# Patient Record
Sex: Male | Born: 1940 | Race: Black or African American | Hispanic: No | Smoking: Former smoker
Health system: Southern US, Community
[De-identification: ages and names within clinical notes are randomized; demographics above are authoritative.]

## PROBLEM LIST (undated history)

## (undated) DIAGNOSIS — M1990 Unspecified osteoarthritis, unspecified site: Secondary | ICD-10-CM

## (undated) DIAGNOSIS — M109 Gout, unspecified: Secondary | ICD-10-CM

## (undated) DIAGNOSIS — K5790 Diverticulosis of intestine, part unspecified, without perforation or abscess without bleeding: Secondary | ICD-10-CM

## (undated) DIAGNOSIS — R519 Headache, unspecified: Secondary | ICD-10-CM

## (undated) DIAGNOSIS — R51 Headache: Secondary | ICD-10-CM

## (undated) DIAGNOSIS — N189 Chronic kidney disease, unspecified: Secondary | ICD-10-CM

## (undated) DIAGNOSIS — E785 Hyperlipidemia, unspecified: Secondary | ICD-10-CM

## (undated) DIAGNOSIS — I10 Essential (primary) hypertension: Secondary | ICD-10-CM

## (undated) HISTORY — DX: Chronic kidney disease, unspecified: N18.9

## (undated) HISTORY — DX: Hyperlipidemia, unspecified: E78.5

## (undated) HISTORY — DX: Diverticulosis of intestine, part unspecified, without perforation or abscess without bleeding: K57.90

## (undated) HISTORY — DX: Essential (primary) hypertension: I10

## (undated) HISTORY — PX: KNEE SURGERY: SHX244

---

## 2004-10-15 ENCOUNTER — Ambulatory Visit: Payer: Self-pay | Admitting: Family Medicine

## 2004-11-04 ENCOUNTER — Ambulatory Visit: Payer: Self-pay | Admitting: Family Medicine

## 2004-11-04 ENCOUNTER — Ambulatory Visit (HOSPITAL_COMMUNITY): Admission: RE | Admit: 2004-11-04 | Discharge: 2004-11-04 | Payer: Self-pay | Admitting: Family Medicine

## 2005-01-16 ENCOUNTER — Ambulatory Visit: Payer: Self-pay | Admitting: Family Medicine

## 2005-02-11 ENCOUNTER — Ambulatory Visit: Payer: Self-pay | Admitting: Family Medicine

## 2005-05-13 ENCOUNTER — Ambulatory Visit: Payer: Self-pay | Admitting: Family Medicine

## 2005-10-29 ENCOUNTER — Ambulatory Visit: Payer: Self-pay | Admitting: Family Medicine

## 2005-12-15 ENCOUNTER — Ambulatory Visit: Payer: Self-pay | Admitting: Family Medicine

## 2006-01-28 ENCOUNTER — Ambulatory Visit: Payer: Self-pay | Admitting: Family Medicine

## 2006-06-17 ENCOUNTER — Encounter (INDEPENDENT_AMBULATORY_CARE_PROVIDER_SITE_OTHER): Payer: Self-pay | Admitting: *Deleted

## 2006-06-17 ENCOUNTER — Ambulatory Visit: Payer: Self-pay | Admitting: Family Medicine

## 2006-06-17 LAB — CONVERTED CEMR LAB
ALT: <8 units/L (ref 0–53)
Albumin: 4.2 g/dL (ref 3.5–5.2)
Basophils Absolute: 0 10*3/uL (ref 0.0–0.1)
Bilirubin, Direct: 0.1 mg/dL (ref 0.0–0.3)
Calcium: 9.3 mg/dL (ref 8.4–10.5)
Creatinine, Ser: 1.3 mg/dL (ref 0.40–1.50)
Lymphocytes Relative: 46 % (ref 12–46)
Lymphs Abs: 2.6 10*3/uL (ref 0.7–3.3)
MCHC: 32.2 g/dL (ref 30.0–36.0)
PSA: 1.25 ng/mL
Platelets: 247 10*3/uL (ref 150–400)
Potassium: 4.6 meq/L (ref 3.5–5.3)
RDW: 13.8 % (ref 11.5–14.0)
Total Bilirubin: 0.5 mg/dL (ref 0.3–1.2)
Total CHOL/HDL Ratio: 6.6
Total Protein: 7.7 g/dL (ref 6.0–8.3)
Triglycerides: 140 mg/dL (ref <150)

## 2006-10-26 ENCOUNTER — Ambulatory Visit: Payer: Self-pay | Admitting: Family Medicine

## 2006-10-26 LAB — CONVERTED CEMR LAB
Albumin: 4.1 g/dL (ref 3.5–5.2)
Alkaline Phosphatase: 63 units/L (ref 39–117)
CO2: 25 meq/L (ref 19–32)
Chloride: 104 meq/L (ref 96–112)
Cholesterol: 154 mg/dL (ref 0–200)
Creatinine, Ser: 1.25 mg/dL (ref 0.40–1.50)
Indirect Bilirubin: 0.4 mg/dL (ref 0.0–0.9)
Potassium: 4.7 meq/L (ref 3.5–5.3)
Total CHOL/HDL Ratio: 5.1
VLDL: 40 mg/dL (ref 0–40)

## 2006-11-03 ENCOUNTER — Encounter: Payer: Self-pay | Admitting: Family Medicine

## 2006-11-03 LAB — CONVERTED CEMR LAB: Glucose, 2 hour: 344 mg/dL — ABNORMAL HIGH (ref 70–139)

## 2006-11-10 ENCOUNTER — Ambulatory Visit: Payer: Self-pay | Admitting: Family Medicine

## 2006-12-17 ENCOUNTER — Ambulatory Visit: Payer: Self-pay | Admitting: Family Medicine

## 2007-02-18 ENCOUNTER — Ambulatory Visit: Payer: Self-pay | Admitting: Family Medicine

## 2007-02-18 LAB — CONVERTED CEMR LAB
ALT: 10 units/L (ref 0–53)
Albumin: 4.3 g/dL (ref 3.5–5.2)
CO2: 24 meq/L (ref 19–32)
Chloride: 104 meq/L (ref 96–112)
Cholesterol: 191 mg/dL (ref 0–200)
Creatinine, Ser: 1.65 mg/dL — ABNORMAL HIGH (ref 0.40–1.50)
HDL: 38 mg/dL — ABNORMAL LOW (ref >39)
Sodium: 141 meq/L (ref 135–145)
Total Bilirubin: 0.4 mg/dL (ref 0.3–1.2)
Total CHOL/HDL Ratio: 5
Triglycerides: 202 mg/dL — ABNORMAL HIGH (ref <150)

## 2007-02-19 ENCOUNTER — Encounter: Payer: Self-pay | Admitting: Family Medicine

## 2007-03-29 ENCOUNTER — Ambulatory Visit: Payer: Self-pay | Admitting: Family Medicine

## 2007-05-24 ENCOUNTER — Encounter (INDEPENDENT_AMBULATORY_CARE_PROVIDER_SITE_OTHER): Payer: Self-pay | Admitting: *Deleted

## 2007-05-24 DIAGNOSIS — E785 Hyperlipidemia, unspecified: Secondary | ICD-10-CM | POA: Insufficient documentation

## 2007-05-24 DIAGNOSIS — E1122 Type 2 diabetes mellitus with diabetic chronic kidney disease: Secondary | ICD-10-CM

## 2007-05-24 DIAGNOSIS — N183 Chronic kidney disease, stage 3 (moderate): Secondary | ICD-10-CM

## 2007-05-27 ENCOUNTER — Ambulatory Visit: Payer: Self-pay | Admitting: Family Medicine

## 2007-05-27 LAB — CONVERTED CEMR LAB
CO2: 23 meq/L (ref 19–32)
Calcium: 9.6 mg/dL (ref 8.4–10.5)
Cholesterol: 179 mg/dL (ref 0–200)
Glucose, Bld: 128 mg/dL — ABNORMAL HIGH (ref 70–99)
HDL: 34 mg/dL — ABNORMAL LOW (ref >39)
Indirect Bilirubin: 0.2 mg/dL (ref 0.0–0.9)
LDL Cholesterol: 115 mg/dL — ABNORMAL HIGH (ref 0–99)
Potassium: 4.6 meq/L (ref 3.5–5.3)
Sodium: 142 meq/L (ref 135–145)
Total CHOL/HDL Ratio: 5.3
Total Protein: 8 g/dL (ref 6.0–8.3)
Triglycerides: 151 mg/dL — ABNORMAL HIGH (ref <150)
VLDL: 30 mg/dL (ref 0–40)

## 2007-09-27 ENCOUNTER — Encounter: Payer: Self-pay | Admitting: Family Medicine

## 2007-09-27 ENCOUNTER — Ambulatory Visit: Payer: Self-pay | Admitting: Family Medicine

## 2007-09-27 DIAGNOSIS — I10 Essential (primary) hypertension: Secondary | ICD-10-CM

## 2007-09-27 LAB — CONVERTED CEMR LAB
ALT: 10 units/L (ref 0–53)
Alkaline Phosphatase: 55 units/L (ref 39–117)
BUN: 19 mg/dL (ref 6–23)
Calcium: 9.3 mg/dL (ref 8.4–10.5)
HDL: 33 mg/dL — ABNORMAL LOW (ref >39)
Potassium: 4.8 meq/L (ref 3.5–5.3)
Total Bilirubin: 0.3 mg/dL (ref 0.3–1.2)
Total Protein: 7.9 g/dL (ref 6.0–8.3)
Triglycerides: 118 mg/dL (ref <150)

## 2007-10-13 ENCOUNTER — Encounter: Payer: Self-pay | Admitting: Family Medicine

## 2007-11-01 ENCOUNTER — Encounter: Payer: Self-pay | Admitting: Family Medicine

## 2007-12-21 ENCOUNTER — Encounter: Payer: Self-pay | Admitting: Family Medicine

## 2008-01-26 ENCOUNTER — Encounter: Payer: Self-pay | Admitting: Family Medicine

## 2008-02-17 ENCOUNTER — Encounter: Payer: Self-pay | Admitting: Family Medicine

## 2008-03-16 ENCOUNTER — Ambulatory Visit (HOSPITAL_COMMUNITY): Admission: RE | Admit: 2008-03-16 | Discharge: 2008-03-16 | Payer: Self-pay | Admitting: Ophthalmology

## 2008-03-21 ENCOUNTER — Ambulatory Visit: Payer: Self-pay | Admitting: Family Medicine

## 2008-03-21 LAB — CONVERTED CEMR LAB: Hgb A1c MFr Bld: 6.3 %

## 2008-03-22 ENCOUNTER — Encounter: Payer: Self-pay | Admitting: Family Medicine

## 2008-03-22 LAB — CONVERTED CEMR LAB
AST: 12 units/L (ref 0–37)
Alkaline Phosphatase: 59 units/L (ref 39–117)
BUN: 21 mg/dL (ref 6–23)
Bilirubin, Direct: <0.1 mg/dL (ref 0.0–0.3)
CO2: 19 meq/L (ref 19–32)
Calcium: 9.3 mg/dL (ref 8.4–10.5)
Chloride: 108 meq/L (ref 96–112)
Cholesterol: 151 mg/dL (ref 0–200)
Creatinine, Ser: 1.67 mg/dL — ABNORMAL HIGH (ref 0.40–1.50)
Creatinine, Urine: 141.2 mg/dL
Glucose, Bld: 95 mg/dL (ref 70–99)
LDL Cholesterol: 96 mg/dL (ref 0–99)
Microalb Creat Ratio: 2.6 mg/g (ref 0.0–30.0)
Potassium: 4.5 meq/L (ref 3.5–5.3)
Sodium: 141 meq/L (ref 135–145)
Total Bilirubin: 0.2 mg/dL — ABNORMAL LOW (ref 0.3–1.2)
Total CHOL/HDL Ratio: 4.6
Triglycerides: 111 mg/dL (ref <150)
VLDL: 22 mg/dL (ref 0–40)

## 2008-03-30 ENCOUNTER — Ambulatory Visit (HOSPITAL_COMMUNITY): Admission: RE | Admit: 2008-03-30 | Discharge: 2008-03-30 | Payer: Self-pay | Admitting: Ophthalmology

## 2008-06-29 ENCOUNTER — Ambulatory Visit: Payer: Self-pay | Admitting: Family Medicine

## 2008-06-29 LAB — CONVERTED CEMR LAB: Hgb A1c MFr Bld: 6.4 %

## 2008-06-30 LAB — CONVERTED CEMR LAB
BUN: 20 mg/dL (ref 6–23)
Basophils Absolute: 0 10*3/uL (ref 0.0–0.1)
CO2: 21 meq/L (ref 19–32)
Chloride: 107 meq/L (ref 96–112)
Creatinine, Ser: 1.65 mg/dL — ABNORMAL HIGH (ref 0.40–1.50)
Hemoglobin: 12.7 g/dL — ABNORMAL LOW (ref 13.0–17.0)
MCHC: 32.1 g/dL (ref 30.0–36.0)
RBC: 4 M/uL — ABNORMAL LOW (ref 4.22–5.81)
TSH: 2.956 microintl units/mL (ref 0.350–4.500)

## 2008-09-28 ENCOUNTER — Ambulatory Visit: Payer: Self-pay | Admitting: Family Medicine

## 2008-09-28 LAB — CONVERTED CEMR LAB
Blood Glucose, Fasting: 112 mg/dL
Hgb A1c MFr Bld: 5.9 %

## 2008-10-31 ENCOUNTER — Encounter: Payer: Self-pay | Admitting: Family Medicine

## 2008-11-09 LAB — CONVERTED CEMR LAB
AST: 14 units/L (ref 0–37)
Albumin: 4.1 g/dL (ref 3.5–5.2)
Alkaline Phosphatase: 52 units/L (ref 39–117)
HDL: 33 mg/dL — ABNORMAL LOW (ref >39)
Indirect Bilirubin: 0.3 mg/dL (ref 0.0–0.9)
PSA: 0.96 ng/mL (ref 0.10–4.00)
Total CHOL/HDL Ratio: 4.4
Total Protein: 7.2 g/dL (ref 6.0–8.3)

## 2008-12-14 ENCOUNTER — Ambulatory Visit: Payer: Self-pay | Admitting: Family Medicine

## 2008-12-14 LAB — CONVERTED CEMR LAB: Glucose, Bld: 176 mg/dL

## 2009-04-19 ENCOUNTER — Ambulatory Visit: Payer: Self-pay | Admitting: Family Medicine

## 2009-04-19 LAB — CONVERTED CEMR LAB
ALT: 15 units/L (ref 0–53)
AST: 16 units/L (ref 0–37)
Albumin: 4.1 g/dL (ref 3.5–5.2)
Alkaline Phosphatase: 67 units/L (ref 39–117)
BUN: 15 mg/dL (ref 6–23)
Bilirubin, Direct: 0.1 mg/dL (ref 0.0–0.3)
CO2: 26 meq/L (ref 19–32)
Calcium: 9.1 mg/dL (ref 8.4–10.5)
Chloride: 103 meq/L (ref 96–112)
Cholesterol: 152 mg/dL (ref 0–200)
Creatinine, Ser: 1.69 mg/dL — ABNORMAL HIGH (ref 0.40–1.50)
Creatinine, Urine: 247.2 mg/dL
Glucose, Bld: 250 mg/dL — ABNORMAL HIGH (ref 70–99)
HDL: 34 mg/dL — ABNORMAL LOW (ref >39)
Indirect Bilirubin: 0.4 mg/dL (ref 0.0–0.9)
LDL Cholesterol: 91 mg/dL (ref 0–99)
Microalb Creat Ratio: 13.6 mg/g (ref 0.0–30.0)
Microalb, Ur: 3.35 mg/dL — ABNORMAL HIGH (ref 0.00–1.89)
Potassium: 4.4 meq/L (ref 3.5–5.3)
Sodium: 139 meq/L (ref 135–145)
Total Bilirubin: 0.5 mg/dL (ref 0.3–1.2)
Total CHOL/HDL Ratio: 4.5
Total Protein: 7.6 g/dL (ref 6.0–8.3)
Triglycerides: 137 mg/dL (ref <150)
VLDL: 27 mg/dL (ref 0–40)

## 2009-09-14 ENCOUNTER — Ambulatory Visit: Payer: Self-pay | Admitting: Family Medicine

## 2009-09-17 LAB — CONVERTED CEMR LAB
BUN: 18 mg/dL (ref 6–23)
Chloride: 104 meq/L (ref 96–112)
Creatinine, Ser: 1.38 mg/dL (ref 0.40–1.50)
Hgb A1c MFr Bld: 13.7 % — ABNORMAL HIGH (ref <5.7)
Sodium: 136 meq/L (ref 135–145)

## 2009-09-24 ENCOUNTER — Encounter: Payer: Self-pay | Admitting: Family Medicine

## 2009-10-30 ENCOUNTER — Telehealth: Payer: Self-pay | Admitting: Family Medicine

## 2009-12-14 ENCOUNTER — Telehealth: Payer: Self-pay | Admitting: Family Medicine

## 2009-12-18 ENCOUNTER — Ambulatory Visit: Payer: Self-pay | Admitting: Family Medicine

## 2009-12-19 LAB — CONVERTED CEMR LAB
Albumin: 4.5 g/dL (ref 3.5–5.2)
BUN: 17 mg/dL (ref 6–23)
Basophils Absolute: 0 10*3/uL (ref 0.0–0.1)
Creatinine, Ser: 1.76 mg/dL — ABNORMAL HIGH (ref 0.40–1.50)
Eosinophils Absolute: 0.1 10*3/uL (ref 0.0–0.7)
Glucose, Bld: 106 mg/dL — ABNORMAL HIGH (ref 70–99)
HCT: 40.3 % (ref 39.0–52.0)
HDL: 33 mg/dL — ABNORMAL LOW (ref >39)
Indirect Bilirubin: 0.3 mg/dL (ref 0.0–0.9)
LDL Cholesterol: 82 mg/dL (ref 0–99)
Lymphocytes Relative: 45 % (ref 12–46)
MCV: 96 fL (ref 78.0–100.0)
Monocytes Absolute: 0.5 10*3/uL (ref 0.1–1.0)
Neutro Abs: 1.8 10*3/uL (ref 1.7–7.7)
PSA: 1.3 ng/mL (ref 0.10–4.00)
Platelets: 219 10*3/uL (ref 150–400)
Potassium: 5 meq/L (ref 3.5–5.3)
Sodium: 144 meq/L (ref 135–145)
TSH: 1.485 microintl units/mL (ref 0.350–4.500)
Total Bilirubin: 0.4 mg/dL (ref 0.3–1.2)
VLDL: 20 mg/dL (ref 0–40)

## 2010-01-21 ENCOUNTER — Encounter: Payer: Self-pay | Admitting: Family Medicine

## 2010-03-10 HISTORY — PX: CATARACT EXTRACTION W/ INTRAOCULAR LENS  IMPLANT, BILATERAL: SHX1307

## 2010-03-22 ENCOUNTER — Ambulatory Visit
Admission: RE | Admit: 2010-03-22 | Discharge: 2010-03-22 | Payer: Self-pay | Source: Home / Self Care | Attending: Family Medicine | Admitting: Family Medicine

## 2010-03-25 LAB — CONVERTED CEMR LAB
AST: 19 units/L (ref 0–37)
BUN: 22 mg/dL (ref 6–23)
CO2: 23 meq/L (ref 19–32)
Chloride: 106 meq/L (ref 96–112)
Potassium: 5 meq/L (ref 3.5–5.3)
Sodium: 140 meq/L (ref 135–145)
Total Protein: 8.2 g/dL (ref 6.0–8.3)

## 2010-03-27 LAB — CONVERTED CEMR LAB: Hgb A1c MFr Bld: 7.3 % — ABNORMAL HIGH (ref <5.7)

## 2010-03-29 ENCOUNTER — Encounter: Payer: Self-pay | Admitting: Family Medicine

## 2010-04-09 NOTE — Progress Notes (Signed)
Summary: NOT CHECKING HIS SUGARS  Phone Note Call from Patient   Summary of Call: PAUL SAID THAT Zyier IS NOT CHECKING HIS SUGARS AND HE STOPPED THE LADY THAT WAS DOING IT 2 X A DAY  AND HE WANTS YOU TO GET ON [place] AND NOT LET HIM KNOW THAT HE SAID ANYTHING ABOUT THIS THEIR APPTOINMENTS ARE TUESDAY 10.11.11 Initial call taken by: Lind Guest,  December 14, 2009 11:03 AM  Follow-up for Phone Call        noted Follow-up by: Syliva Overman MD,  December 14, 2009 12:19 PM

## 2010-04-09 NOTE — Letter (Signed)
Summary: med list review sheet  med list review sheet   Imported By: Lind Guest 12/18/2009 09:03:06  _____________________________________________________________________  External Attachment:    Type:   Image     Comment:   External Document

## 2010-04-09 NOTE — Letter (Signed)
Summary: rx assistance  rx assistance   Imported By: Lind Guest 01/21/2010 10:49:01  _____________________________________________________________________  External Attachment:    Type:   Image     Comment:   External Document

## 2010-04-09 NOTE — Assessment & Plan Note (Signed)
Summary: office visit   Vital Signs:  Patient profile:   70 year old male Height:      73 inches Weight:      196 pounds BMI:     25.95 O2 Sat:      97 % Pulse rate:   83 / minute Pulse rhythm:   regular Resp:     16 per minute BP sitting:   118 / 80 Cuff size:   regular  Vitals Entered By: Everitt Amber (April 19, 2009 8:12 AM)  Nutrition Counseling: Patient's BMI is greater than 25 and therefore counseled on weight management options. CC: follow-up visit   Primary Care Provider:  Syliva Overman MD  CC:  follow-up visit.  History of Present Illness: Reports  that he has been  doing well. Denies recent fever or chills. Denies sinus pressure, nasal congestion , ear pain or sore throat. Denies chest congestion, or cough productive of sputum. Denies chest pain, palpitations, PND, orthopnea or leg swelling. Denies abdominal pain, nausea, vomitting, diarrhea or constipation. Denies change in bowel movements or bloody stool. Denies dysuria , frequency, incontinence or hesitancy. Denies  joint pain, swelling, or reduced mobility. Denies headaches, vertigo, seizures. Denies depression, anxiety or insomnia. Denies  rash, lesions, or itch.     Current Medications (verified): 1)  Pravachol 40 Mg  Tabs (Pravastatin Sodium) .... Take 1 Tablet By Mouth Once A Day 2)  Aspirin Adult Low Strength 81 Mg Tbec (Aspirin) .... Take 1 Tablet By Mouth Once A Day 3)  Lotensin 10 Mg Tabs (Benazepril Hcl) .... Take 1 Tablet By Mouth Once A Day 4)  Glucotrol Xl 5 Mg Xr24h-Tab (Glipizide) .... 2 Tablets in The Morning At Breakfast and 1 Tablet Iin The Evening At Supper Time 5)  Vitamin D3 2000 Unit Caps (Cholecalciferol) .... Take 1 Tablet By Mouth Once A Day  Allergies (verified): No Known Drug Allergies  Review of Systems      See HPI Eyes:  Denies blurring and discharge. Endo:  Denies cold intolerance, excessive hunger, excessive thirst, excessive urination, heat intolerance,  polyuria, and weight change. Heme:  Denies abnormal bruising and bleeding. Allergy:  Denies hives or rash, seasonal allergies, and sneezing.  Physical Exam  General:  alert, well-nourished, and well-hydrated.  HEENT: No facial asymmetry,  EOMI, No sinus tenderness, TM's Clear, oropharynx  pink and moist.   Chest: Clear to auscultation bilaterally.  CVS: S1, S2, No murmurs, No S3.   Abd: Soft, Nontender.  MS: Adequate ROM spine, hips, shoulders and knees.  Ext: No edema.   CNS: CN 2-12 intact, power tone and sensation normal throughout.   Skin: Intact, no visible lesions or rashes.  Psych: Good eye contact, normal affect.  Memory intact, not anxious or depressed appearing.     Impression & Recommendations:  Problem # 1:  UNSPECIFIED ESSENTIAL HYPERTENSION (ICD-401.9) Assessment Improved  The following medications were removed from the medication list:    Lotensin 10 Mg Tabs (Benazepril hcl) .Marland Kitchen... Take 1 tablet by mouth once a day His updated medication list for this problem includes:    Benazepril Hcl 10 Mg Tabs (Benazepril hcl) .Marland Kitchen... Take 1 tablet by mouth once a day  Orders: T-Basic Metabolic Panel [id number])  BP today: 118/80 Prior BP: 140/80 (12/14/2008)  Labs Reviewed: K+: 5.1 (06/29/2008) Creat: : 1.65 (06/29/2008)   Chol: 145 (11/09/2008)   HDL: 33 (11/09/2008)   LDL: 85 (11/09/2008)   TG: 136 (11/09/2008)  Problem # 2:  DIABETES MELLITUS, TYPE II (  ICD-250.00) Assessment: Deteriorated  The following medications were removed from the medication list:    Lotensin 10 Mg Tabs (Benazepril hcl) .Marland Kitchen... Take 1 tablet by mouth once a day    Glucotrol Xl 5 Mg Xr24h-tab (Glipizide) .Marland Kitchen... 2 tablets in the morning at breakfast and 1 tablet iin the evening at supper time    Glipizide 5 Mg Xr24h-tab (Glipizide) .Marland Kitchen... Take 2 tablets in the morning and 1 in the evening His updated medication list for this problem includes:    Aspirin Adult Low Strength 81 Mg Tbec (Aspirin)  .Marland Kitchen... Take 1 tablet by mouth once a day    Benazepril Hcl 10 Mg Tabs (Benazepril hcl) .Marland Kitchen... Take 1 tablet by mouth once a day    Aspirin 81 Mg Tbec (Aspirin) .Marland Kitchen... Take 1 tablet by mouth once a day    Glipizide 10 Mg Xr24h-tab (Glipizide) .Marland Kitchen... Take 1 tablet by mouth two times a day  Orders: Glucose, (CBG) (82962) Hemoglobin A1C (83036) T-Urine Microalbumin w/creat. ratio [phone])  Labs Reviewed: Creat: 1.65 (06/29/2008)    Reviewed HgBA1c results: 10.6 (04/19/2009)  8.3 (12/14/2008)  Problem # 3:  HYPERLIPIDEMIA (ICD-272.4) Assessment: Comment Only  The following medications were removed from the medication list:    Pravachol 40 Mg Tabs (Pravastatin sodium) .Marland Kitchen... Take 1 tablet by mouth once a day His updated medication list for this problem includes:    Pravastatin Sodium 40 Mg Tabs (Pravastatin sodium) .Marland Kitchen... Take 1 tab by mouth at bedtime  Orders: T-Lipid Profile [id number]) T-Hepatic Function [phone])  Labs Reviewed: SGOT: 14 (11/09/2008)   SGPT: 10 (11/09/2008)   HDL:33 (11/09/2008), 33 (03/22/2008)  LDL:85 (11/09/2008), 96 (03/22/2008)  Chol:145 (11/09/2008), 151 (03/22/2008)  Trig:136 (11/09/2008), 111 (03/22/2008)  Complete Medication List: 1)  Aspirin Adult Low Strength 81 Mg Tbec (Aspirin) .... Take 1 tablet by mouth once a day 2)  Vitamin D3 2000 Unit Caps (Cholecalciferol) .... Take 1 tablet by mouth once a day 3)  Pravastatin Sodium 40 Mg Tabs (Pravastatin sodium) .... Take 1 tab by mouth at bedtime 4)  Benazepril Hcl 10 Mg Tabs (Benazepril hcl) .... Take 1 tablet by mouth once a day 5)  Aspirin 81 Mg Tbec (Aspirin) .... Take 1 tablet by mouth once a day 6)  Glipizide 10 Mg Xr24h-tab (Glipizide) .... Take 1 tablet by mouth two times a day  Patient Instructions: 1)  Please schedule a follow-up appointment in 4 months. 2)  It is important that you exercise regularly at least 30 minutes 5 times a week. If you develop chest pain, have severe  difficulty breathing, or feel very tired , stop exercising immediately and seek medical attention. 3)  BMP prior to visit, ICD-9: 4)  Hepatic Panel prior to visit, ICD-9: 5)  Lipid Panel prior to visit, ICD-9: 6)  Urine Microalbumin prior to visit, ICD-9: 7)  yOUR blood sugar is way  too high, your med is iinc to 10mg  twice daily Prescriptions: GLIPIZIDE 10 MG XR24H-TAB (GLIPIZIDE) Take 1 tablet by mouth two times a day  #180 x 3   Entered and Authorized by:   Syliva Overman MD   Signed by:   Syliva Overman MD on 04/19/2009   Method used:   Printed then faxed to ...       [facility]       [address]       [place], [place]  [address]  Ph: [phone] or [phone]       Fax: [phone]   RxID:   [id number] ASPIRIN 81 MG TBEC (ASPIRIN) Take 1 tablet by mouth once a day  #90 x 3   Entered and Authorized by:   Syliva Overman MD   Signed by:   Syliva Overman MD on 04/19/2009   Method used:   Printed then faxed to ...       [facility] (retail)       [address]       [place], [place]  [address]       Ph: [phone] or [phone]       Fax: [phone]   RxID:   [id number] GLIPIZIDE 5 MG XR24H-TAB (GLIPIZIDE) take 2 tablets in the morning and 1 in the evening  #270 x 3   Entered and Authorized by:   Syliva Overman MD   Signed by:   Syliva Overman MD on 04/19/2009   Method used:   Printed then faxed to ...       [facility] (retail)       [address]       [place], [place]  [address]       Ph: [phone] or [phone]       Fax: [phone]   RxID:   [id number] BENAZEPRIL HCL 10 MG TABS (BENAZEPRIL HCL) Take 1 tablet by mouth once a day  #90 x 3   Entered and Authorized by:   Syliva Overman MD   Signed by:   Syliva Overman MD on 04/19/2009   Method used:   Printed then faxed to ...       [facility]       [address]       [place], [place]  [address]       Ph: [phone] or [phone]       Fax: [phone]   RxID:   [id number] PRAVASTATIN SODIUM 40 MG TABS (PRAVASTATIN SODIUM) Take 1 tab by mouth at bedtime  #90 x 3   Entered and Authorized by:   Syliva Overman MD   Signed by:   Syliva Overman MD on 04/19/2009   Method used:   Printed then faxed to ...       [facility] (retail)       [address]       [place], [place]  [address]       Ph: [phone] or [phone]       Fax: [phone]   RxID:   [id number]   Laboratory Results   Blood Tests   Date/Time Received: April 19, 2009  Date/Time Reported: April 19, 2009   Glucose (fasting): 276 mg/dL   (Normal Range: 74-944) HGBA1C: 10.6%   (Normal Range: Non-Diabetic - 3-6%   Control Diabetic - 6-8%)

## 2010-04-09 NOTE — Assessment & Plan Note (Signed)
Summary: f up   Vital Signs:  Patient profile:   70 year old male Height:      73 inches Weight:      184.50 pounds BMI:     24.43 O2 Sat:      97 % on Room air Pulse rate:   82 / minute Pulse rhythm:   regular Resp:     16 per minute BP sitting:   118 / 72  (left arm)  Vitals Entered By: Mauricia Area, CMA  O2 Flow:  Room air CC: Follow up. Both eyes bothering him.    Primary Care Provider:  Syliva Overman MD  CC:  Follow up. Both eyes bothering him. Barry Welch  History of Present Illness: Reports  thathe has been doing fairly well. He reports improving his diet , so that his blood sugars will be better controlled.He is still not checking his sugars. He c/o bilateral eye swelling with excessive tearing , rigth grtr than left, he was recently seen by the opthalmologist Denies recent fever or chills. Denies sinus pressure, nasal congestion , ear pain or sore throat. Denies chest congestion, or cough productive of sputum. Denies chest pain, palpitations, PND, orthopnea or leg swelling. Denies abdominal pain, nausea, vomitting, diarrhea or constipation. Denies change in bowel movements or bloody stool. Denies dysuria , frequency, incontinence or hesitancy. Denies  joint pain, swelling, or reduced mobility. Denies headaches, vertigo, seizures. Denies depression, anxiety or insomnia. Denies  rash, lesions, or itch.     Current Medications (verified): 1)  Aspirin Adult Low Strength 81 Mg Tbec (Aspirin) .... Take 1 Tablet By Mouth Once A Day 2)  Vitamin D3 2000 Unit Caps (Cholecalciferol) .... Take 1 Tablet By Mouth Once A Day 3)  Pravastatin Sodium 40 Mg Tabs (Pravastatin Sodium) .... Take 1 Tab By Mouth At Bedtime 4)  Benazepril Hcl 10 Mg Tabs (Benazepril Hcl) .... Take 1 Tablet By Mouth Once A Day 5)  Aspirin 81 Mg Tbec (Aspirin) .... Take 1 Tablet By Mouth Once A Day 6)  Glipizide 10 Mg Xr24h-Tab (Glipizide) .... Take 1 Tablet By Mouth Two Times A Day  Allergies  (verified): No Known Drug Allergies  Past History:  Past Medical History: Diabetes mellitus, type II Hyperlipidemia Nicotine  quit in 2009 Alcohol denies current use  Past Surgical History: ORIF left knee (1986) R cataract extraction 03/2008  left cataract extraction 2010   Social History: Disabled x12 yrs Separated x25 5 Children 30s and 40s Current Smoker 65yrs  quit since March 2009  Alcohol use-yes 50yrs  Drug use-no  Review of Systems      See HPI General:  Complains of fatigue. Eyes:  Complains of discharge and eye irritation. Neuro:  Complains of headaches; frontal headache and right eye painx 1 week. Endo:  Denies excessive hunger, excessive thirst, excessive urination, and heat intolerance. Heme:  Denies abnormal bruising and bleeding. Allergy:  Complains of seasonal allergies.  Physical Exam  General:  alert, well-nourished, and well-hydrated.  HEENT: No facial asymmetry,  EOMI, No sinus tenderness, TM's Clear, oropharynx  pink and moist.   Chest: Clear to auscultation bilaterally.  CVS: S1, S2, No murmurs, No S3.   Abd: Soft, Nontender.  MS: Adequate ROM spine, hips, shoulders and knees.  Ext: No edema.   CNS: CN 2-12 intact, power tone and sensation normal throughout.   Skin: Intact, no visible lesions or rashes.  Psych: Good eye contact, normal affect.  Memory intact, not anxious or depressed appearing.  Impression & Recommendations:  Problem # 1:  UNSPECIFIED ESSENTIAL HYPERTENSION (ICD-401.9) Assessment Unchanged  His updated medication list for this problem includes:    Benazepril Hcl 10 Mg Tabs (Benazepril hcl) .Barry Welch... Take 1 tablet by mouth once a day  Orders: T-Basic Metabolic Panel [id number])  BP today: 118/72 Prior BP: 120/70 (09/14/2009)  Labs Reviewed: K+: 4.3 (09/14/2009) Creat: : 1.38 (09/14/2009)   Chol: 152 (04/19/2009)   HDL: 34 (04/19/2009)   LDL: 91 (04/19/2009)   TG: 137 (04/19/2009)  Problem # 2:  HYPERLIPIDEMIA  (ICD-272.4) Assessment: Comment Only  His updated medication list for this problem includes:    Pravastatin Sodium 40 Mg Tabs (Pravastatin sodium) .Barry Welch... Take 1 tab by mouth at bedtime Low fat dietdiscussed and encouraged  Orders: T-Lipid Profile [id number])  Labs Reviewed: SGOT: 16 (04/19/2009)   SGPT: 15 (04/19/2009)   HDL:34 (04/19/2009), 33 (11/09/2008)  LDL:91 (04/19/2009), 85 (11/09/2008)  Chol:152 (04/19/2009), 145 (11/09/2008)  Trig:137 (04/19/2009), 136 (11/09/2008)  Problem # 3:  DIABETES MELLITUS, TYPE II (ICD-250.00)  The following medications were removed from the medication list:    Onglyza 5 Mg Tabs (Saxagliptin hcl) ..... One tab by mouth once daily His updated medication list for this problem includes:    Aspirin Adult Low Strength 81 Mg Tbec (Aspirin) .Barry Welch... Take 1 tablet by mouth once a day    Benazepril Hcl 10 Mg Tabs (Benazepril hcl) .Barry Welch... Take 1 tablet by mouth once a day    Aspirin 81 Mg Tbec (Aspirin) .Barry Welch... Take 1 tablet by mouth once a day    Glipizide 10 Mg Xr24h-tab (Glipizide) .Barry Welch... Take 1 tablet by mouth two times a day    Onglyza 5 Mg Tabs (Saxagliptin hcl) .Barry Welch... Take 1 tablet by mouth once a day Patient advised to reduce carbs and sweets, commit to regular physical activity, take meds as prescribed, test blood sugars as directed, and attempt to lose weight , to improve blood sugar control. Pt will have h/h referralif today's hBA1C is still extremely high, he understands and agrees. Orders: T- Hemoglobin A1C ([id number])  Labs Reviewed: Creat: 1.38 (09/14/2009)    Reviewed HgBA1c results: 13.7 (09/14/2009)  10.6 (04/19/2009)  Complete Medication List: 1)  Aspirin Adult Low Strength 81 Mg Tbec (Aspirin) .... Take 1 tablet by mouth once a day 2)  Vitamin D3 2000 Unit Caps (Cholecalciferol) .... Take 1 tablet by mouth once a day 3)  Pravastatin Sodium 40 Mg Tabs (Pravastatin sodium) .... Take 1 tab by mouth at bedtime 4)  Benazepril Hcl 10 Mg Tabs  (Benazepril hcl) .... Take 1 tablet by mouth once a day 5)  Aspirin 81 Mg Tbec (Aspirin) .... Take 1 tablet by mouth once a day 6)  Glipizide 10 Mg Xr24h-tab (Glipizide) .... Take 1 tablet by mouth two times a day 7)  Onglyza 5 Mg Tabs (Saxagliptin hcl) .... Take 1 tablet by mouth once a day 8)  Azithromycin Opthalmic Solution 1%  .Barry Welch.. 1 drop twice daily to each eye  Other Orders: T-Hepatic Function [id number]) T-PSA [id number]) T-TSH [id number]) T-CBC w/Diff [id number]) Influenza Vaccine NON MCR ([id number])  Patient Instructions: 1)  Please schedule a follow-up appointment in 3 months. 2)  BMP prior to visit, ICD-9: 3)  Hepatic Panel prior to visit, ICD-9: 4)  Lipid Panel prior to visit, ICD-9: 5)  TSH prior to visit, ICD-9:   today 6)  PSA prior to visit, ICD-9: 7)  HbgA1C prior to visit, ICD-9: 8)  CBC w/ Diff prior to visit, ICD-9: 9)  If your blood sugars are still uncontrolled , I will be referring home health services to help to teach you to use insulin and to check your sugars 10)  The medication list was reviewed and reconciled..All changed/newly prescribed medications were explained. A complete medication list was provided to the patient/caregiver.    Influenza Vaccine    Vaccine Type: Fluvax Non-MCR    Site: right deltoid    Mfr: novartis    Dose: 0.5 ml    Route: IM    Given by: Mauricia Area, CMA    Exp. Date: 07/2010    Lot #: [id number]    VIS given: 10/02/09 version given December 18, 2009.

## 2010-04-09 NOTE — Progress Notes (Signed)
Summary: SAMPLES  Phone Note Call from Patient   Summary of Call: DO YOU HAVE SAMPLES OF HIS MEDICINE WANTS TO KNOW Initial call taken by: Lind Guest,  October 30, 2009 8:59 AM  Follow-up for Phone Call        left brother detailed message advising no samples available at this time Follow-up by: Adella Hare LPN,  October 30, 2009 11:45 AM

## 2010-04-09 NOTE — Miscellaneous (Signed)
Summary: new med  Clinical Lists Changes  Medications: Added new medication of ONGLYZA 5 MG TABS (SAXAGLIPTIN HCL) one tab by mouth once daily

## 2010-04-09 NOTE — Assessment & Plan Note (Signed)
Summary: follow up   Vital Signs:  Patient profile:   70 year old male Height:      73 inches Weight:      189.25 pounds BMI:     25.06 O2 Sat:      97 % on Room air Pulse rate:   85 / minute Pulse rhythm:   regular Resp:     16 per minute BP sitting:   120 / 70  (left arm)  Vitals Entered By: Adella Hare LPN (September 14, 1608 9:56 AM)  Nutrition Counseling: Patient's BMI is greater than 25 and therefore counseled on weight management options.  O2 Flow:  Room air CC: follow-up visit Is Patient Diabetic? Yes Did you bring your meter with you today? No Pain Assessment Patient in pain? no      Comments complains of some eye pressure but states has eye appt soon   Primary Care Provider:  Syliva Overman MD  CC:  follow-up visit.  History of Present Illness: Reports  that he has been  doing well. Denies recent fever or chills. Denies sinus pressure, nasal congestion , ear pain or sore throat. Denies chest congestion, or cough productive of sputum. Denies chest pain, palpitations, PND, orthopnea or leg swelling. Denies abdominal pain, nausea, vomitting, diarrhea or constipation. Denies change in bowel movements or bloody stool. Denies dysuria , frequency, incontinence or hesitancy. Denies  joint pain, swelling, or reduced mobility. Denies headaches, vertigo, seizures. Denies depression, anxiety or insomnia. Denies  rash, lesions, or itch. The pt still does not test his sugar , states he does not know how to do this, and no-one else around can do this for him. Initially he had help. He is asymtomatic as far as hypo or hyperglycemic symptoms are concerned. He does have limitation in his mental capacity, he no longer smokes , but still drinks beer per his rept not often and not much.     Current Medications (verified): 1)  Aspirin Adult Low Strength 81 Mg Tbec (Aspirin) .... Take 1 Tablet By Mouth Once A Day 2)  Vitamin D3 2000 Unit Caps (Cholecalciferol) .... Take 1  Tablet By Mouth Once A Day 3)  Pravastatin Sodium 40 Mg Tabs (Pravastatin Sodium) .... Take 1 Tab By Mouth At Bedtime 4)  Benazepril Hcl 10 Mg Tabs (Benazepril Hcl) .... Take 1 Tablet By Mouth Once A Day 5)  Aspirin 81 Mg Tbec (Aspirin) .... Take 1 Tablet By Mouth Once A Day 6)  Glipizide 10 Mg Xr24h-Tab (Glipizide) .... Take 1 Tablet By Mouth Two Times A Day  Allergies (verified): No Known Drug Allergies  Review of Systems      See HPI General:  Complains of fatigue. Eyes:  Complains of vision loss-both eyes; s/p bilateral cataract extraction. Endo:  Denies cold intolerance, excessive hunger, excessive thirst, excessive urination, and heat intolerance. Heme:  Denies abnormal bruising and bleeding. Allergy:  Denies hives or rash and itching eyes.  Physical Exam  General:  alert, well-nourished, and well-hydrated.  HEENT: No facial asymmetry,  EOMI, No sinus tenderness, TM's Clear, oropharynx  pink and moist.   Chest: Clear to auscultation bilaterally.  CVS: S1, S2, No murmurs, No S3.   Abd: Soft, Nontender.  MS: Adequate ROM spine, hips, shoulders and knees.  Ext: No edema.   CNS: CN 2-12 intact, power tone and sensation normal throughout.   Skin: Intact, no visible lesions or rashes.  Psych: Good eye contact, normal affect.  Memory intact, not anxious or depressed  appearing.     Impression & Recommendations:  Problem # 1:  UNSPECIFIED ESSENTIAL HYPERTENSION (ICD-401.9) Assessment Unchanged  His updated medication list for this problem includes:    Benazepril Hcl 10 Mg Tabs (Benazepril hcl) .Marland Kitchen... Take 1 tablet by mouth once a day  BP today: 120/70 Prior BP: 118/80 (04/19/2009)  Labs Reviewed: K+: 4.4 (04/19/2009) Creat: : 1.69 (04/19/2009)   Chol: 152 (04/19/2009)   HDL: 34 (04/19/2009)   LDL: 91 (04/19/2009)   TG: 137 (04/19/2009)  Problem # 2:  HYPERLIPIDEMIA (ICD-272.4) Assessment: Comment Only  His updated medication list for this problem includes:     Pravastatin Sodium 40 Mg Tabs (Pravastatin sodium) .Marland Kitchen... Take 1 tab by mouth at bedtime  Labs Reviewed: SGOT: 16 (04/19/2009)   SGPT: 15 (04/19/2009)   HDL:34 (04/19/2009), 33 (11/09/2008)  LDL:91 (04/19/2009), 85 (11/09/2008)  Chol:152 (04/19/2009), 145 (11/09/2008)  Trig:137 (04/19/2009), 136 (11/09/2008)  Problem # 3:  DIABETES MELLITUS, TYPE II (ICD-250.00) Assessment: Deteriorated  His updated medication list for this problem includes:    Aspirin Adult Low Strength 81 Mg Tbec (Aspirin) .Marland Kitchen... Take 1 tablet by mouth once a day    Benazepril Hcl 10 Mg Tabs (Benazepril hcl) .Marland Kitchen... Take 1 tablet by mouth once a day    Aspirin 81 Mg Tbec (Aspirin) .Marland Kitchen... Take 1 tablet by mouth once a day    Glipizide 10 Mg Xr24h-tab (Glipizide) .Marland Kitchen... Take 1 tablet by mouth two times a day  Labs Reviewed: Creat: 1.69 (04/19/2009)    Reviewed HgBA1c results: 10.6 (04/19/2009)  8.3 (12/14/2008)  Complete Medication List: 1)  Aspirin Adult Low Strength 81 Mg Tbec (Aspirin) .... Take 1 tablet by mouth once a day 2)  Vitamin D3 2000 Unit Caps (Cholecalciferol) .... Take 1 tablet by mouth once a day 3)  Pravastatin Sodium 40 Mg Tabs (Pravastatin sodium) .... Take 1 tab by mouth at bedtime 4)  Benazepril Hcl 10 Mg Tabs (Benazepril hcl) .... Take 1 tablet by mouth once a day 5)  Aspirin 81 Mg Tbec (Aspirin) .... Take 1 tablet by mouth once a day 6)  Glipizide 10 Mg Xr24h-tab (Glipizide) .... Take 1 tablet by mouth two times a day  Patient Instructions: 1)  Please schedule a follow-up appointment in 3 months. 2)  It is not healthy  for men to drink more than 2-3 drinks per day or for women to drink more than 1-2 drinks per day. 3)  You need to lose weight. Consider a lower calorie diet and regular exercise.  4)  BMP prior to visit, ICD-9: 5)  HbgA1C prior to visit, ICD-9:   today

## 2010-04-11 NOTE — Letter (Signed)
Summary: [facility]  [facility]   Imported By: Lind Guest 04/03/2010 17:28:41  _____________________________________________________________________  External Attachment:    Type:   Image     Comment:   External Document

## 2010-04-11 NOTE — Assessment & Plan Note (Signed)
Summary: f up   Vital Signs:  Patient profile:   70 year old male Height:      73 inches Weight:      191 pounds BMI:     25.29 O2 Sat:      96 % Pulse rate:   81 / minute Pulse rhythm:   regular Resp:     16 per minute BP sitting:   108 / 64  (left arm) Cuff size:   regular  Vitals Entered By: Everitt Amber LPN (March 22, 2010 8:12 AM)  Nutrition Counseling: Patient's BMI is greater than 25 and therefore counseled on weight management options. CC: Follow up chronic problems   Primary Care Provider:  Syliva Overman MD  CC:  Follow up chronic problems.  History of Present Illness: Reports  that he has been  doing well. Denies recent fever or chills. Denies sinus pressure, nasal congestion , ear pain or sore throat. Denies chest congestion, or cough productive of sputum. Denies chest pain, palpitations, PND, orthopnea or leg swelling. Denies abdominal pain, nausea, vomitting, diarrhea or constipation. Denies change in bowel movements or bloody stool. Denies dysuria , frequency, incontinence or hesitancy. Denies  joint pain, swelling, or reduced mobility. Denies headaches, vertigo, seizures. Denies depression, anxiety or insomnia. Denies  rash, lesions, or itch.     Current Medications (verified): 1)  Pravastatin Sodium 40 Mg Tabs (Pravastatin Sodium) .... Take 1 Tab By Mouth At Bedtime 2)  Benazepril Hcl 10 Mg Tabs (Benazepril Hcl) .... Take 1 Tablet By Mouth Once A Day 3)  Aspirin 81 Mg Tbec (Aspirin) .... Take 1 Tablet By Mouth Once A Day 4)  Glipizide 10 Mg Xr24h-Tab (Glipizide) .... Take 1 Tablet By Mouth Two Times A Day 5)  Onglyza 5 Mg Tabs (Saxagliptin Hcl) .... Take 1 Tablet By Mouth Once A Day 6)  Azasite 1 % Soln (Azithromycin) .Marland Kitchen.. 1 Drop To Each Eye Daily 7)  Calcium + D 600-200 Mg-Unit Tabs (Calcium Carbonate-Vitamin D) .... Take 1 Tablet By Mouth Once A Day  Allergies (verified): No Known Drug Allergies  Review of Systems      See HPI General:   Complains of fatigue. Eyes:  Denies discharge, eye pain, and red eye. Neuro:  Complains of memory loss; h/o chronic alcohol use. Endo:  Denies excessive hunger and excessive urination. Heme:  Denies abnormal bruising and bleeding. Allergy:  Denies hives or rash and itching eyes.  Physical Exam  General:  Well-developed,well-nourished,in no acute distress; alert,appropriate and cooperative throughout examination HEENT: No facial asymmetry,  EOMI, No sinus tenderness, TM's Clear, oropharynx  pink and moist.   Chest: Clear to auscultation bilaterally.  CVS: S1, S2, No murmurs, No S3.   Abd: Soft, Nontender.  MS: Adequate ROM spine, hips, shoulders and knees.  Ext: No edema.   CNS: CN 2-12 intact, power tone and sensation normal throughout.   Skin: Intact, no visible lesions or rashes.  Psych: Good eye contact, normal affect.  Memory intact, not anxious or depressed appearing.    Impression & Recommendations:  Problem # 1:  UNSPECIFIED ESSENTIAL HYPERTENSION (ICD-401.9) Assessment Unchanged  His updated medication list for this problem includes:    Benazepril Hcl 10 Mg Tabs (Benazepril hcl) .Marland Kitchen... Take 1 tablet by mouth once a day  Orders: Medicare Electronic Prescription [phone]) T-CMP with estimated GFR ([id number])  BP today: 108/64 Prior BP: 118/72 (12/18/2009)  Labs Reviewed: K+: 5.0 (12/18/2009) Creat: : 1.76 (12/18/2009)   Chol: 135 (12/18/2009)   HDL: 33 (  12/18/2009)   LDL: 82 (12/18/2009)   TG: 98 (12/18/2009)  Problem # 2:  HYPERLIPIDEMIA (ICD-272.4) Assessment: Comment Only  His updated medication list for this problem includes:    Pravastatin Sodium 40 Mg Tabs (Pravastatin sodium) .Marland Kitchen... Take 1 tab by mouth at bedtime Low fat dietdiscussed and encouraged   Orders: Medicare Electronic Prescription [phone])  Labs Reviewed: SGOT: 22 (12/18/2009)   SGPT: 21 (12/18/2009)   HDL:33 (12/18/2009), 34 (04/19/2009)  LDL:82 (12/18/2009), 91 (04/19/2009)  Chol:135  (12/18/2009), 152 (04/19/2009)  Trig:98 (12/18/2009), 137 (04/19/2009)  Problem # 3:  DIABETES MELLITUS, TYPE II (ICD-250.00)  The following medications were removed from the medication list:    Aspirin Adult Low Strength 81 Mg Tbec (Aspirin) .Marland Kitchen... Take 1 tablet by mouth once a day His updated medication list for this problem includes:    Benazepril Hcl 10 Mg Tabs (Benazepril hcl) .Marland Kitchen... Take 1 tablet by mouth once a day    Aspirin 81 Mg Tbec (Aspirin) .Marland Kitchen... Take 1 tablet by mouth once a day    Glipizide 10 Mg Xr24h-tab (Glipizide) .Marland Kitchen... Take 1 tablet by mouth two times a day    Onglyza 5 Mg Tabs (Saxagliptin hcl) .Marland Kitchen... Take 1 tablet by mouth once a day Patient advised to reduce carbs and sweets, commit to regular physical activity, take meds as prescribed, test blood sugars as directed, and attempt to lose weight , to improve blood sugar control.  Orders: Medicare Electronic Prescription [phone]) T- Hemoglobin A1C [phone])  Labs Reviewed: Creat: 1.76 (12/18/2009)    Reviewed HgBA1c results: 7.3 (12/18/2009)  13.7 (09/14/2009)  Complete Medication List: 1)  Pravastatin Sodium 40 Mg Tabs (Pravastatin sodium) .... Take 1 tab by mouth at bedtime 2)  Benazepril Hcl 10 Mg Tabs (Benazepril hcl) .... Take 1 tablet by mouth once a day 3)  Aspirin 81 Mg Tbec (Aspirin) .... Take 1 tablet by mouth once a day 4)  Glipizide 10 Mg Xr24h-tab (Glipizide) .... Take 1 tablet by mouth two times a day 5)  Onglyza 5 Mg Tabs (Saxagliptin hcl) .... Take 1 tablet by mouth once a day 6)  Azasite 1 % Soln (Azithromycin) .Marland Kitchen.. 1 drop to each eye daily 7)  Calcium + D 600-200 Mg-unit Tabs (Calcium carbonate-vitamin d) .... Take 1 tablet by mouth once a day  Patient Instructions: 1)  cPE in 3.5 months 2)  It is important that you exercise regularly at least 20 minutes 5 times a week. If you develop chest pain, have severe difficulty breathing, or feel very tired , stop exercising immediately and seek medical  attention. 3)  You need to lose weight. Consider a lower calorie diet and regular exercise.  4)  Check your blood sugars regularly. If your readings are usually above 250 or below 70 you should contact our office. 5)  hBA1c bMP and egfr  today. 6)  pt will need fasting labs at next visit, he is aware, and will get the lab sheet the day of the visit 7)  meds will be refilled today for the next 4 months Prescriptions: GLIPIZIDE 10 MG XR24H-TAB (GLIPIZIDE) Take 1 tablet by mouth two times a day  #180 x 3   Entered by:   Everitt Amber LPN   Authorized by:   Syliva Overman MD   Signed by:   Everitt Amber LPN on 18/84/1660   Method used:   Faxed to ...       [facility]       [address]       [place], [place]  [address]       Ph: [phone] or [phone]       Fax: [phone]   RxID:   [id number] BENAZEPRIL HCL 10 MG TABS (BENAZEPRIL HCL) Take 1 tablet by mouth once a day  #90 x 3   Entered by:   Everitt Amber LPN   Authorized by:   Syliva Overman MD   Signed by:   Everitt Amber LPN on 64/40/3474   Method used:   Faxed to ...       Hospital doctor (retail)       [address]       [place], [place]  [address]       Ph: [phone] or [phone]       Fax: [phone]   RxID:   [id number] PRAVASTATIN SODIUM 40 MG TABS (PRAVASTATIN SODIUM) Take 1 tab by mouth at bedtime  #90 x 3   Entered by:   Everitt Amber LPN   Authorized by:   Syliva Overman MD   Signed by:   Everitt Amber LPN on 37/62/8315   Method used:   Faxed to ...       Hospital doctor (retail)       [address]       [place], [place]  [address]       Ph: [phone] or [phone]       Fax: [phone]   RxID:   [id number]    Orders Added: 1)  Est. Patient Level IV [10258] 2)  Medicare Electronic Prescription [G8553] 3)  T-CMP with estimated GFR [[address]] 4)  T- Hemoglobin A1C  [[address]36-[address]]

## 2010-05-27 ENCOUNTER — Encounter: Payer: Self-pay | Admitting: Family Medicine

## 2010-06-06 NOTE — Miscellaneous (Signed)
Summary: Samples  Clinical Lists Changes  Medications: Changed medication from ONGLYZA 5 MG TABS (SAXAGLIPTIN HCL) Take 1 tablet by mouth once a day to ONGLYZA 5 MG TABS (SAXAGLIPTIN HCL) Take 1 tablet by mouth once a day - Signed Rx of ONGLYZA 5 MG TABS (SAXAGLIPTIN HCL) Take 1 tablet by mouth once a day;  #28 tabs x 0;  Signed;  Entered by: Everitt Amber LPN;  Authorized by: Syliva Overman MD;  Method used: Samples Given    Prescriptions: ONGLYZA 5 MG TABS (SAXAGLIPTIN HCL) Take 1 tablet by mouth once a day  #28 tabs x 0   Entered by:   Everitt Amber LPN   Authorized by:   Syliva Overman MD   Signed by:   Everitt Amber LPN on 45/40/9811   Method used:   Samples Given   RxID:   [id number]

## 2010-06-24 LAB — GLUCOSE, CAPILLARY: Glucose-Capillary: 104 mg/dL — ABNORMAL HIGH (ref 70–99)

## 2010-06-24 LAB — BASIC METABOLIC PANEL
BUN: 14 mg/dL (ref 6–23)
CO2: 26 mEq/L (ref 19–32)
Calcium: 9.2 mg/dL (ref 8.4–10.5)
Chloride: 106 mEq/L (ref 96–112)
GFR calc non Af Amer: 46 mL/min — ABNORMAL LOW (ref >60)
Glucose, Bld: 102 mg/dL — ABNORMAL HIGH (ref 70–99)

## 2010-06-24 LAB — HEMOGLOBIN AND HEMATOCRIT, BLOOD: HCT: 35.1 % — ABNORMAL LOW (ref 39.0–52.0)

## 2010-06-27 ENCOUNTER — Other Ambulatory Visit: Payer: Self-pay

## 2010-06-27 DIAGNOSIS — E119 Type 2 diabetes mellitus without complications: Secondary | ICD-10-CM

## 2010-06-27 MED ORDER — SAXAGLIPTIN HCL 5 MG PO TABS: 5.0000 mg | ORAL_TABLET | Freq: Every day | ORAL | Status: DC

## 2010-07-01 ENCOUNTER — Other Ambulatory Visit: Payer: Self-pay | Admitting: *Deleted

## 2010-07-01 ENCOUNTER — Telehealth: Payer: Self-pay | Admitting: Family Medicine

## 2010-07-01 DIAGNOSIS — E119 Type 2 diabetes mellitus without complications: Secondary | ICD-10-CM

## 2010-07-01 MED ORDER — SAXAGLIPTIN HCL 5 MG PO TABS: ORAL_TABLET | ORAL | Status: DC

## 2010-07-01 NOTE — Telephone Encounter (Signed)
Med sent as requested 

## 2010-07-02 ENCOUNTER — Other Ambulatory Visit: Payer: Self-pay

## 2010-07-02 DIAGNOSIS — E119 Type 2 diabetes mellitus without complications: Secondary | ICD-10-CM

## 2010-07-02 MED ORDER — SAXAGLIPTIN HCL 5 MG PO TABS: ORAL_TABLET | ORAL | Status: DC

## 2010-07-02 NOTE — Telephone Encounter (Signed)
RX sent to [facility] 

## 2010-07-25 ENCOUNTER — Encounter: Payer: Self-pay | Admitting: Family Medicine

## 2010-09-01 ENCOUNTER — Emergency Department (HOSPITAL_COMMUNITY)
Admission: EM | Admit: 2010-09-01 | Discharge: 2010-09-01 | Disposition: A | Discharge status: Home / Self Care | Payer: Medicare Other | Attending: Emergency Medicine | Admitting: Emergency Medicine

## 2010-09-01 DIAGNOSIS — E7800 Pure hypercholesterolemia, unspecified: Secondary | ICD-10-CM | POA: Insufficient documentation

## 2010-09-01 DIAGNOSIS — Z79899 Other long term (current) drug therapy: Secondary | ICD-10-CM | POA: Insufficient documentation

## 2010-09-01 DIAGNOSIS — Z7982 Long term (current) use of aspirin: Secondary | ICD-10-CM | POA: Insufficient documentation

## 2010-09-01 DIAGNOSIS — R51 Headache: Secondary | ICD-10-CM | POA: Insufficient documentation

## 2010-09-01 DIAGNOSIS — I10 Essential (primary) hypertension: Secondary | ICD-10-CM | POA: Insufficient documentation

## 2010-09-01 DIAGNOSIS — E119 Type 2 diabetes mellitus without complications: Secondary | ICD-10-CM | POA: Insufficient documentation

## 2010-09-01 LAB — GLUCOSE, CAPILLARY

## 2010-09-05 ENCOUNTER — Encounter: Payer: Self-pay | Admitting: Family Medicine

## 2010-09-06 ENCOUNTER — Encounter: Payer: Self-pay | Admitting: Family Medicine

## 2010-09-06 ENCOUNTER — Ambulatory Visit (INDEPENDENT_AMBULATORY_CARE_PROVIDER_SITE_OTHER): Payer: Medicare Other | Admitting: Family Medicine

## 2010-09-06 VITALS — BP 138/74 | HR 83 | Resp 16 | Ht 73.0 in | Wt 190.4 lb

## 2010-09-06 DIAGNOSIS — R51 Headache: Secondary | ICD-10-CM

## 2010-09-06 DIAGNOSIS — I10 Essential (primary) hypertension: Secondary | ICD-10-CM

## 2010-09-06 DIAGNOSIS — E119 Type 2 diabetes mellitus without complications: Secondary | ICD-10-CM

## 2010-09-06 DIAGNOSIS — Z0000 Encounter for general adult medical examination without abnormal findings: Secondary | ICD-10-CM

## 2010-09-06 DIAGNOSIS — H6120 Impacted cerumen, unspecified ear: Secondary | ICD-10-CM

## 2010-09-06 DIAGNOSIS — Z23 Encounter for immunization: Secondary | ICD-10-CM

## 2010-09-06 DIAGNOSIS — E785 Hyperlipidemia, unspecified: Secondary | ICD-10-CM

## 2010-09-06 DIAGNOSIS — Z1211 Encounter for screening for malignant neoplasm of colon: Secondary | ICD-10-CM

## 2010-09-06 LAB — POC HEMOCCULT BLD/STL (OFFICE/1-CARD/DIAGNOSTIC): Fecal Occult Blood, POC: POSITIVE

## 2010-09-06 LAB — HEMOGLOBIN A1C: Mean Plasma Glucose: 349 mg/dL — ABNORMAL HIGH (ref <117)

## 2010-09-06 LAB — LIPID PANEL
Cholesterol: 160 mg/dL (ref 0–200)
Total CHOL/HDL Ratio: 4.7 Ratio

## 2010-09-06 MED ORDER — KETOROLAC TROMETHAMINE 60 MG/2ML IM SOLN
60.0000 mg | Freq: Once | INTRAMUSCULAR | Status: AC
  Administered 2010-09-06: 60 mg via INTRAMUSCULAR

## 2010-09-06 NOTE — Patient Instructions (Signed)
F/u in 4 months.  Injection today for headache also your tetanus vaccine.  Fasting labs today

## 2010-09-07 ENCOUNTER — Encounter: Payer: Self-pay | Admitting: Family Medicine

## 2010-09-07 DIAGNOSIS — R51 Headache: Secondary | ICD-10-CM | POA: Insufficient documentation

## 2010-09-07 DIAGNOSIS — R519 Headache, unspecified: Secondary | ICD-10-CM | POA: Insufficient documentation

## 2010-09-07 DIAGNOSIS — H6120 Impacted cerumen, unspecified ear: Secondary | ICD-10-CM | POA: Insufficient documentation

## 2010-09-07 LAB — COMPLETE METABOLIC PANEL WITH GFR
ALT: 21 U/L (ref 0–53)
AST: 23 U/L (ref 0–37)
Albumin: 4.4 g/dL (ref 3.5–5.2)
Alkaline Phosphatase: 57 U/L (ref 39–117)
BUN: 17 mg/dL (ref 6–23)
CO2: 24 mEq/L (ref 19–32)
Calcium: 9.2 mg/dL (ref 8.4–10.5)
Chloride: 104 mEq/L (ref 96–112)
GFR, Est Non African American: 38 mL/min — ABNORMAL LOW (ref >60)
Glucose, Bld: 276 mg/dL — ABNORMAL HIGH (ref 70–99)
Total Bilirubin: 0.5 mg/dL (ref 0.3–1.2)
Total Protein: 7.5 g/dL (ref 6.0–8.3)

## 2010-09-07 NOTE — Assessment & Plan Note (Signed)
Increased triglycerides, needs to reduce fried and fatty foods

## 2010-09-07 NOTE — Assessment & Plan Note (Signed)
Controlled, no change in medication  

## 2010-09-07 NOTE — Progress Notes (Signed)
  Subjective:    Patient ID: Barry Welch, male    DOB: 08/14/1941, 70 y.o.   MRN: [id number]  HPI The PT is here for annual exam and re-evaluation of chronic medical conditions, medication management and review of any  recent lab and radiology data.  Preventive health is updated, specifically  Cancer screening,  and Immunization.    The PT denies any adverse reactions to current medications since the last visit.  He c/o frontal headaches with uncontrolled allergy symptoms for the past 2 weeks, and was actually recently in the ED for this. He does not test his blood sugars, but denies poyluria, polydipsia, fatigue, blurred vision or hypoglycemic episodes     Review of Systems Denies recent fever or chills. c/o sinus pressure, nasal congestion, post nasal drainage which is clear, and left ear pressure Denies chest congestion, productive cough or wheezing. Denies chest pains, palpitations, paroxysmal nocturnal dyspnea, orthopnea and leg swelling Denies abdominal pain, nausea, vomiting,diarrhea or constipation.  Denies rectal bleeding or change in bowel movement. Denies dysuria, frequency, hesitancy or incontinence. Denies joint pain, swelling and limitation in mobility. Denies  seizure, numbness, or tingling. Denies depression, anxiety or insomnia. Denies skin break down or rash.        Objective:   Physical Exam Pleasant well nourished male, alert and oriented x 3, in no cardio-pulmonary distress. Afebrile. HEENT No facial trauma or asymetry.   EOMI, PERTL, fundoscopic exam is negative for hemorhages or exudates. External ears normal, tympanic membranes clear on right, left partially occluded by cerumen Oropharynx moist, no exudate, good dentition. Neck: supple, no adenopathy,JVD or thyromegaly.No bruits.  Chest: Clear to ascultation bilaterally.No crackles or wheezes. Non tender to palpation  Breast: No asymetry,no masses. No nipple discharge or inversion. No axillary  or supraclavicular adenopathy  Cardiovascular system; Heart sounds normal,  S1 and  S2 ,no S3.  No murmur, or thrill. Apical beat not displaced Peripheral pulses normal.  Abdomen: Soft, non tender, no organomegaly or masses. No bruits. Bowel sounds normal. No guarding, tenderness or rebound.  Rectal:  No mass. guaic positive stool Prostate smooth and firm  GU: No penile lesion or discharge. No testicular mass.  Musculoskeletal exam: Full ROM of spine, hips , shoulders and knees. No deformity ,swelling or crepitus noted. No muscle wasting or atrophy.   Neurologic: Cranial nerves 2 to 12 intact. Power, tone ,sensation and reflexes normal throughout. No disturbance in gait. No tremor.  Skin: Intact, no ulceration, erythema , scaling or rash noted. 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 Judgement and concentration impaired, mild mental retardation        Assessment & Plan:

## 2010-09-07 NOTE — Assessment & Plan Note (Signed)
Markedly uncontrolled, pt absolutely needs to change diet and start testing regularly, he will be contacted next week

## 2010-09-07 NOTE — Assessment & Plan Note (Signed)
Left cerumen impaction, successful ear irrigation , with  No trauma

## 2010-09-07 NOTE — Assessment & Plan Note (Addendum)
Uncontrolled, no focal deficits on exam

## 2010-09-19 ENCOUNTER — Ambulatory Visit: Payer: Medicare Other | Admitting: Gastroenterology

## 2010-10-31 ENCOUNTER — Other Ambulatory Visit: Payer: Self-pay | Admitting: Family Medicine

## 2011-01-03 ENCOUNTER — Encounter: Payer: Self-pay | Admitting: Family Medicine

## 2011-01-07 ENCOUNTER — Encounter: Payer: Self-pay | Admitting: Family Medicine

## 2011-01-07 ENCOUNTER — Other Ambulatory Visit: Payer: Self-pay | Admitting: Family Medicine

## 2011-01-07 ENCOUNTER — Ambulatory Visit (INDEPENDENT_AMBULATORY_CARE_PROVIDER_SITE_OTHER): Payer: Medicare HMO | Admitting: Family Medicine

## 2011-01-07 VITALS — BP 118/78 | HR 96 | Resp 16 | Ht 73.0 in | Wt 184.0 lb

## 2011-01-07 DIAGNOSIS — E785 Hyperlipidemia, unspecified: Secondary | ICD-10-CM

## 2011-01-07 DIAGNOSIS — E119 Type 2 diabetes mellitus without complications: Secondary | ICD-10-CM

## 2011-01-07 DIAGNOSIS — Z23 Encounter for immunization: Secondary | ICD-10-CM

## 2011-01-07 DIAGNOSIS — I10 Essential (primary) hypertension: Secondary | ICD-10-CM

## 2011-01-07 LAB — CBC
HCT: 42.8 % (ref 39.0–52.0)
Hemoglobin: 14.3 g/dL (ref 13.0–17.0)
MCHC: 33.4 g/dL (ref 30.0–36.0)
MCV: 93.4 fL (ref 78.0–100.0)

## 2011-01-07 MED ORDER — INSULIN ASPART 100 UNIT/ML ~~LOC~~ SOLN
7.0000 [IU] | Freq: Once | SUBCUTANEOUS | Status: AC
  Administered 2011-01-07: 7 [IU] via SUBCUTANEOUS

## 2011-01-07 NOTE — Assessment & Plan Note (Signed)
Controlled, no change in medication  

## 2011-01-07 NOTE — Assessment & Plan Note (Signed)
Hyperlipidemia:Low fat diet discussed and encouraged.  Needs to reduce fried and fatty foods

## 2011-01-07 NOTE — Patient Instructions (Addendum)
F/u in 3.5 months  Pneumonia and flu vaccine today  Labs today  Pls follow a diabetic diet , your blood sugars need to improve  Goal for fasting blood sugar ranges from 80 to 120 and 2 hours after any meal or at bedtime should be between 130 to 170.   You need to make appt as was discussed before for a colonscopy

## 2011-01-07 NOTE — Assessment & Plan Note (Addendum)
Uncontrolled, compliant with meds but not testing, will f/u on lab today

## 2011-01-08 LAB — BASIC METABOLIC PANEL
BUN: 22 mg/dL (ref 6–23)
Glucose, Bld: 363 mg/dL — ABNORMAL HIGH (ref 70–99)
Potassium: 5.3 mEq/L (ref 3.5–5.3)

## 2011-01-08 LAB — MICROALBUMIN / CREATININE URINE RATIO: Microalb Creat Ratio: 10.3 mg/g (ref 0.0–30.0)

## 2011-01-08 LAB — TSH: TSH: 1.708 u[IU]/mL (ref 0.350–4.500)

## 2011-01-08 NOTE — Progress Notes (Signed)
  Subjective:    Patient ID: Barry Welch, male    DOB: 10/18/1940, 70 y.o.   MRN: [id number]  HPI The PT is here for follow up and re-evaluation of chronic medical conditions, medication management and review of any available recent lab and radiology data.  Preventive health is updated, specifically  Cancer screening and Immunization.   Questions or concerns regarding consultations or procedures which the PT has had in the interim are  addressed. The PT denies any adverse reactions to current medications since the last visit.  There are no new concerns.  There are no specific complaints       Review of Systems See HPI Denies recent fever or chills. Denies sinus pressure, nasal congestion, ear pain or sore throat. Denies chest congestion, productive cough or wheezing. Denies chest pains, palpitations and leg swelling Denies abdominal pain, nausea, vomiting,diarrhea or constipation.   Denies dysuria, frequency, hesitancy or incontinence. Denies joint pain, swelling and limitation in mobility. Denies headaches, seizures, numbness, or tingling. Denies depression, anxiety or insomnia. Denies skin break down or rash.    Patient alert and oriented and in no cardiopulmonary distress.  HEENT: No facial asymmetry, EOMI, no sinus tenderness,  oropharynx pink and moist.  Neck supple no adenopathy.  Chest: Clear to auscultation bilaterally.  CVS: S1, S2 no murmurs, no S3.  ABD: Soft non tender. Bowel sounds normal.  Ext: No edema  MS: Adequate ROM spine, shoulders, hips and knees.  Skin: Intact, no ulcerations or rash noted.  Psych: Good eye contact, normal affect. Memory intact not anxious or depressed appearing.  CNS: CN 2-12 intact, power, tone and sensation normal throughout.     Objective:   Physical Exam        Assessment & Plan:

## 2011-01-23 ENCOUNTER — Encounter: Payer: Self-pay | Admitting: Family Medicine

## 2011-01-25 ENCOUNTER — Other Ambulatory Visit: Payer: Self-pay | Admitting: Family Medicine

## 2011-01-25 ENCOUNTER — Telehealth: Payer: Self-pay | Admitting: Family Medicine

## 2011-01-25 MED ORDER — INSULIN GLARGINE 100 UNIT/ML ~~LOC~~ SOLN: 10.0000 [IU] | Freq: Every day | SUBCUTANEOUS | Status: DC

## 2011-01-25 NOTE — Telephone Encounter (Signed)
Home health nurse called stating pt's blood sugar was over 400, no insulin available, pt reportedly has a lot of sugary food and drink in the house, no support and unable to test sugar I advised lantus 10 units once daily at breakfast, he has been referred to endo and his appt is upcoming reportedly 27th. Nurse stated she would attempt to get the insulin at the pharmacy

## 2011-01-29 ENCOUNTER — Telehealth: Payer: Self-pay | Admitting: Family Medicine

## 2011-01-29 NOTE — Telephone Encounter (Signed)
pls note no more refills from this office from pt and d/c home health services from this office, care is now through [facility], I will write notes to both  For you to fax

## 2011-01-29 NOTE — Telephone Encounter (Signed)
Faxed

## 2011-02-11 DIAGNOSIS — Z794 Long term (current) use of insulin: Secondary | ICD-10-CM

## 2011-02-11 DIAGNOSIS — E1165 Type 2 diabetes mellitus with hyperglycemia: Secondary | ICD-10-CM

## 2011-02-11 DIAGNOSIS — I10 Essential (primary) hypertension: Secondary | ICD-10-CM

## 2011-05-08 ENCOUNTER — Ambulatory Visit: Payer: Self-pay | Admitting: Family Medicine

## 2011-07-09 ENCOUNTER — Encounter: Payer: Self-pay | Admitting: Gastroenterology

## 2011-07-28 ENCOUNTER — Encounter: Payer: Self-pay | Admitting: Gastroenterology

## 2011-07-28 ENCOUNTER — Ambulatory Visit (AMBULATORY_SURGERY_CENTER): Payer: Medicare HMO | Admitting: *Deleted

## 2011-07-28 VITALS — Ht 73.0 in | Wt 190.0 lb

## 2011-07-28 DIAGNOSIS — Z1211 Encounter for screening for malignant neoplasm of colon: Secondary | ICD-10-CM

## 2011-07-28 MED ORDER — PEG-KCL-NACL-NASULF-NA ASC-C 100 G PO SOLR: ORAL | Status: DC

## 2011-08-11 ENCOUNTER — Encounter: Payer: Self-pay | Admitting: Gastroenterology

## 2011-08-11 ENCOUNTER — Ambulatory Visit (AMBULATORY_SURGERY_CENTER): Payer: Medicare HMO | Admitting: Gastroenterology

## 2011-08-11 VITALS — BP 129/70 | HR 96 | Temp 98.1°F | Resp 17 | Ht 73.0 in | Wt 190.0 lb

## 2011-08-11 DIAGNOSIS — Z1211 Encounter for screening for malignant neoplasm of colon: Secondary | ICD-10-CM

## 2011-08-11 DIAGNOSIS — K5730 Diverticulosis of large intestine without perforation or abscess without bleeding: Secondary | ICD-10-CM

## 2011-08-11 MED ORDER — SODIUM CHLORIDE 0.9 % IV SOLN: 500.0000 mL | INTRAVENOUS | Status: DC

## 2011-08-11 NOTE — Op Note (Signed)
[facility] [address] [place], [place]  [address]  COLONOSCOPY PROCEDURE REPORT  PATIENT:  Barry Welch, Barry Welch  MR#:  [id number] BIRTHDATE:  1940/04/01, 70 yrs. old  GENDER:  male ENDOSCOPIST:  Vania Rea. Jarold Motto, MD, [facility] REF. BY:  Belva Agee, NP PROCEDURE DATE:  08/11/2011 PROCEDURE:  Average-risk screening colonoscopy G0121 ASA CLASS:  Class III INDICATIONS:  Routine Risk Screening MEDICATIONS:   propofol (Diprivan) 150 mg IV  DESCRIPTION OF PROCEDURE:   After the risks and benefits and of the procedure were explained, informed consent was obtained. Digital rectal exam was performed and revealed no abnormalities. The LB [id number] endoscope was introduced through the anus and advanced to the cecum, which was identified by both the appendix and ileocecal valve.  The quality of the prep was excellent, using MoviPrep.  The instrument was then slowly withdrawn as the colon was fully examined. <<PROCEDUREIMAGES>>  FINDINGS:  There were mild diverticular changes in left colon. diverticulosis was found.  No polyps or cancers were seen. Internal Hemorrhoids were found.   Retroflexed views in the rectum revealed hemorrhoids.    The scope was then withdrawn from the patient and the procedure completed.  COMPLICATIONS:  None ENDOSCOPIC IMPRESSION: 1) Diverticulosis,mild,left sided diverticulosis 2) No polyps or cancers 3) Internal hemorrhoids RECOMMENDATIONS: 1) High fiber diet. 2) Continue current medications  REPEAT EXAM:  No  ______________________________ Vania Rea. Jarold Motto, MD, [facility]  CC:  n. eSIGNED:   Vania Rea. Dorthea Maina at 08/11/2011 11:54 AM  Elijah Birk, [phone]

## 2011-08-11 NOTE — Progress Notes (Signed)
Patient did not experience any of the following events: a burn prior to discharge; a fall within the facility; wrong site/side/patient/procedure/implant event; or a hospital transfer or hospital admission upon discharge from the facility. (G8907) Patient did not have preoperative order for IV antibiotic SSI prophylaxis. (G8918)  

## 2011-08-11 NOTE — Progress Notes (Signed)
Propofol per [facility] camp crna. See scanned crna intra procedure report. ewm 

## 2011-08-11 NOTE — Patient Instructions (Signed)
Findings:  Diverticulosis, Internal Hemorrhoids Recommendations:  High Fiber Diet, Continue current medications  YOU HAD AN ENDOSCOPIC PROCEDURE TODAY AT THE [facility]: Refer to the procedure report that was given to you for any specific questions about what was found during the examination.  If the procedure report does not answer your questions, please call your gastroenterologist to clarify.  If you requested that your care partner not be given the details of your procedure findings, then the procedure report has been included in a sealed envelope for you to review at your convenience later.  YOU SHOULD EXPECT: Some feelings of bloating in the abdomen. Passage of more gas than usual.  Walking can help get rid of the air that was put into your GI tract during the procedure and reduce the bloating. If you had a lower endoscopy (such as a colonoscopy or flexible sigmoidoscopy) you may notice spotting of blood in your stool or on the toilet paper. If you underwent a bowel prep for your procedure, then you may not have a normal bowel movement for a few days.  DIET: Your first meal following the procedure should be a light meal and then it is ok to progress to your normal diet.  A half-sandwich or bowl of soup is an example of a good first meal.  Heavy or fried foods are harder to digest and may make you feel nauseous or bloated.  Likewise meals heavy in dairy and vegetables can cause extra gas to form and this can also increase the bloating.  Drink plenty of fluids but you should avoid alcoholic beverages for 24 hours.  ACTIVITY: Your care partner should take you home directly after the procedure.  You should plan to take it easy, moving slowly for the rest of the day.  You can resume normal activity the day after the procedure however you should NOT DRIVE or use heavy machinery for 24 hours (because of the sedation medicines used during the test).    SYMPTOMS TO REPORT IMMEDIATELY: A  gastroenterologist can be reached at any hour.  During normal business hours, 8:30 AM to 5:00 PM Monday through Friday, call [phone].  After hours and on weekends, please call the GI answering service at [phone] who will take a message and have the physician on call contact you.   Following lower endoscopy (colonoscopy or flexible sigmoidoscopy):  Excessive amounts of blood in the stool  Significant tenderness or worsening of abdominal pains  Swelling of the abdomen that is new, acute  Fever of 100F or higher  Following upper endoscopy (EGD)  Vomiting of blood or coffee ground material  New chest pain or pain under the shoulder blades  Painful or persistently difficult swallowing  New shortness of breath  Fever of 100F or higher  Black, tarry-looking stools  FOLLOW UP: If any biopsies were taken you will be contacted by phone or by letter within the next 1-3 weeks.  Call your gastroenterologist if you have not heard about the biopsies in 3 weeks.  Our staff will call the home number listed on your records the next business day following your procedure to check on you and address any questions or concerns that you may have at that time regarding the information given to you following your procedure. This is a courtesy call and so if there is no answer at the home number and we have not heard from you through the emergency physician on call, we will assume that you have  returned to your regular daily activities without incident.  SIGNATURES/CONFIDENTIALITY: You and/or your care partner have signed paperwork which will be entered into your electronic medical record.  These signatures attest to the fact that that the information above on your After Visit Summary has been reviewed and is understood.  Full responsibility of the confidentiality of this discharge information lies with you and/or your care-partner.   Please follow all discharge instructions given to you by the recovery  room nurse. If you have any questions or problems after discharge please call one of the numbers listed above. You will receive a phone call in the am to see how you are doing and answer any questions you may have. Thank you for choosing [facility] for your health care needs.

## 2011-08-12 ENCOUNTER — Telehealth: Payer: Self-pay | Admitting: *Deleted

## 2011-08-12 NOTE — Telephone Encounter (Signed)
Left message to call office if questions or concerns. 

## 2012-02-13 ENCOUNTER — Other Ambulatory Visit: Payer: Self-pay | Admitting: Family Medicine

## 2012-05-24 ENCOUNTER — Encounter: Payer: Self-pay | Admitting: Family Medicine

## 2012-05-24 ENCOUNTER — Other Ambulatory Visit: Payer: Self-pay | Admitting: Family Medicine

## 2012-05-25 LAB — COMPLETE METABOLIC PANEL WITH GFR
ALT: 20 U/L (ref 0–53)
AST: 22 U/L (ref 0–37)
Albumin: 4.4 g/dL (ref 3.5–5.2)
Alkaline Phosphatase: 40 U/L (ref 39–117)
BUN: 28 mg/dL — ABNORMAL HIGH (ref 6–23)
CO2: 25 mEq/L (ref 19–32)
Calcium: 9.4 mg/dL (ref 8.4–10.5)
Chloride: 105 mEq/L (ref 96–112)
Creat: 2.29 mg/dL — ABNORMAL HIGH (ref 0.50–1.35)
GFR, Est African American: 32 mL/min — ABNORMAL LOW
GFR, Est Non African American: 27 mL/min — ABNORMAL LOW
Glucose, Bld: 201 mg/dL — ABNORMAL HIGH (ref 70–99)
Potassium: 4.9 mEq/L (ref 3.5–5.3)
Sodium: 137 mEq/L (ref 135–145)
Total Bilirubin: 0.5 mg/dL (ref 0.3–1.2)
Total Protein: 7.4 g/dL (ref 6.0–8.3)

## 2012-05-25 LAB — MICROALBUMIN, URINE: Microalb, Ur: 0.5 mg/dL (ref 0.00–1.89)

## 2012-05-27 LAB — NMR LIPOPROFILE WITH LIPIDS
Cholesterol, Total: 129 mg/dL (ref <200)
HDL Particle Number: 26 umol/L — ABNORMAL LOW (ref >=30.5)
HDL Size: 8.3 nm — ABNORMAL LOW (ref >=9.2)
HDL-C: 31 mg/dL — ABNORMAL LOW (ref >=40)
LDL (calc): 75 mg/dL (ref <100)
LDL Particle Number: 1459 nmol/L — ABNORMAL HIGH (ref <1000)
LDL Size: 20.5 nm — ABNORMAL LOW (ref >20.5)
LP-IR Score: 56 — ABNORMAL HIGH (ref <=45)
Large HDL-P: <1.3 umol/L — ABNORMAL LOW (ref >=4.8)
Large VLDL-P: <0.8 nmol/L (ref <=2.7)
Small LDL Particle Number: 822 nmol/L — ABNORMAL HIGH (ref <=527)
Triglycerides: 116 mg/dL (ref <150)
VLDL Size: 50.7 nm — ABNORMAL HIGH (ref >=46.6)

## 2012-05-27 NOTE — Progress Notes (Signed)
Quick Note:  Call patient. Labs normal.on micral No change in plan. ______

## 2012-05-27 NOTE — Progress Notes (Signed)
Quick Note:  Call patient. Labs better in regards to the lipids. In regards to the CKD it is Stable. I did not see the HGBA1C No change in plan. ______

## 2012-06-09 ENCOUNTER — Other Ambulatory Visit: Payer: Self-pay | Admitting: *Deleted

## 2012-06-09 MED ORDER — ATORVASTATIN CALCIUM 40 MG PO TABS: 40.0000 mg | ORAL_TABLET | Freq: Every day | ORAL | Status: DC

## 2012-06-29 ENCOUNTER — Other Ambulatory Visit: Payer: Self-pay

## 2012-06-29 MED ORDER — FENOFIBRATE 145 MG PO TABS: 145.0000 mg | ORAL_TABLET | Freq: Every day | ORAL | Status: DC

## 2012-06-30 ENCOUNTER — Encounter: Payer: Self-pay | Admitting: *Deleted

## 2012-07-14 ENCOUNTER — Encounter: Payer: Self-pay | Admitting: *Deleted

## 2012-08-20 ENCOUNTER — Ambulatory Visit: Payer: Self-pay | Admitting: Family Medicine

## 2012-08-24 ENCOUNTER — Ambulatory Visit: Payer: Self-pay | Admitting: Family Medicine

## 2012-08-26 ENCOUNTER — Other Ambulatory Visit: Payer: Self-pay | Admitting: Family Medicine

## 2012-09-02 ENCOUNTER — Ambulatory Visit: Payer: Self-pay | Admitting: Family Medicine

## 2012-09-23 ENCOUNTER — Encounter: Payer: Self-pay | Admitting: Family Medicine

## 2012-09-23 ENCOUNTER — Ambulatory Visit (INDEPENDENT_AMBULATORY_CARE_PROVIDER_SITE_OTHER): Payer: Medicare HMO | Admitting: Family Medicine

## 2012-09-23 VITALS — BP 127/74 | HR 80 | Temp 97.4°F | Wt 199.8 lb

## 2012-09-23 DIAGNOSIS — E119 Type 2 diabetes mellitus without complications: Secondary | ICD-10-CM

## 2012-09-23 DIAGNOSIS — I10 Essential (primary) hypertension: Secondary | ICD-10-CM

## 2012-09-23 DIAGNOSIS — E785 Hyperlipidemia, unspecified: Secondary | ICD-10-CM

## 2012-09-23 LAB — POCT GLYCOSYLATED HEMOGLOBIN (HGB A1C): Hemoglobin A1C: 6.7

## 2012-09-23 NOTE — Progress Notes (Signed)
Patient ID: Barry Welch, male   DOB: 1940/11/08, 72 y.o.   MRN: [id number] SUBJECTIVE: CC: Chief Complaint  Patient presents with  . Follow-up    3 month follow up   HPI: Patient is here for follow up of Diabetes Mellitus: Symptoms of DM: Denies Nocturia ,Denies Urinary Frequency , denies Blurred vision ,deniesDizziness,denies.Dysuria,denies paresthesias, denies extremity pain or ulcers.Barry Welch chest pain. has had an annual eye exam. do check the feet. Does check CBGs. Average CBG:has been good. Denies episodes of hypoglycemia. Does have an emergency hypoglycemic plan. admits toCompliance with medications. Denies Problems with medications.  Past Medical History  Diagnosis Date  . Hypertension   . Hyperlipidemia   . Diabetes mellitus   . Diverticulosis    Past Surgical History  Procedure Laterality Date  . Cataract extraction w/ intraocular lens  implant, bilateral  2012  . Knee surgery      ORIF LEFT KNEE    History   Social History  . Marital Status: Legally Separated    Spouse Name: [name]    Number of Children: [name]  . Years of Education: [name]   Occupational History  . Not on file.   Social History Main Topics  . Smoking status: Former Smoker    Quit date: 03/11/2007  . Smokeless tobacco: Never Used  . Alcohol Use: No  . Drug Use: No  . Sexually Active: Not on file   Other Topics Concern  . Not on file   Social History Narrative   DISABLED X 12 YRS    SEPARATED X 12 YRS     5 CHILDREN         No family history on file. Current Outpatient Prescriptions on File Prior to Visit  Medication Sig Dispense Refill  . ACCU-CHEK COMPACT STRIPS test strip CHECK BLOOD SUGAR ONCE A DAY  50 each  5  . aspirin 81 MG tablet Take 81 mg by mouth daily.        . benazepril (LOTENSIN) 10 MG tablet Take 5 mg by mouth daily.       . fenofibrate (TRICOR) 145 MG tablet TAKE 1 TABLET DAILY  30 tablet  2  . fish oil-omega-3 fatty acids 1000 MG capsule Take 2 g by mouth daily.         . insulin NPH-insulin regular (NOVOLIN 70/30) (70-30) 100 UNIT/ML injection Inject into the skin 2 (two) times daily at 10 AM and 5 PM. Before breakfast and supper/sliding scale      . Polyethyl Glycol-Propyl Glycol (SYSTANE ULTRA OP) Apply 1 drop to eye as needed. Dry eyes      . pravastatin (PRAVACHOL) 40 MG tablet Take 40 mg by mouth at bedtime.        . Calcium Carbonate-Vitamin D (CALCIUM 600/VITAMIN D PO) Take 1 tablet by mouth daily.        . ONGLYZA 5 MG TABS tablet TAKE 1 TABLET DAILY  30 tablet  3   No current facility-administered medications on file prior to visit.   No Known Allergies Immunization History  Administered Date(s) Administered  . H1N1 03/21/2008  . Influenza Split 01/07/2011  . Influenza Whole 12/14/2008, 12/18/2009  . Pneumococcal Polysaccharide 01/07/2011  . Tdap 09/06/2010   Prior to Admission medications   Medication Sig Start Date End Date Taking? Authorizing Provider  ACCU-CHEK COMPACT STRIPS test strip CHECK BLOOD SUGAR ONCE A DAY 02/13/12  Yes Kerri Perches, MD  aspirin 81 MG tablet Take 81 mg by mouth daily.  Yes Historical Provider, MD  benazepril (LOTENSIN) 10 MG tablet Take 5 mg by mouth daily.    Yes Historical Provider, MD  clobetasol cream (TEMOVATE) 0.05 %  06/21/12  Yes Historical Provider, MD  fenofibrate (TRICOR) 145 MG tablet TAKE 1 TABLET DAILY 08/26/12  Yes Ernestina Penna, MD  fish oil-omega-3 fatty acids 1000 MG capsule Take 2 g by mouth daily.     Yes Historical Provider, MD  insulin NPH-insulin regular (NOVOLIN 70/30) (70-30) 100 UNIT/ML injection Inject into the skin 2 (two) times daily at 10 AM and 5 PM. Before breakfast and supper/sliding scale   Yes Historical Provider, MD  Polyethyl Glycol-Propyl Glycol (SYSTANE ULTRA OP) Apply 1 drop to eye as needed. Dry eyes   Yes Historical Provider, MD  pravastatin (PRAVACHOL) 40 MG tablet Take 40 mg by mouth at bedtime.     Yes Historical Provider, MD  Calcium Carbonate-Vitamin D  (CALCIUM 600/VITAMIN D PO) Take 1 tablet by mouth daily.      Historical Provider, MD  NOVOLOG MIX 70/30 FLEXPEN (70-30) 100 UNIT/ML SUPN  08/26/12   Historical Provider, MD  ONGLYZA 5 MG TABS tablet TAKE 1 TABLET DAILY 10/31/10   Kerri Perches, MD  TRADJENTA 5 MG TABS tablet 5 mg daily.  06/29/12   Historical Provider, MD    ROS: As above in the HPI. All other systems are stable or negative.  OBJECTIVE: APPEARANCE:  Patient in no acute distress.The patient appeared well nourished and normally developed. Acyanotic. Waist: VITAL SIGNS:BP 127/74  Pulse 80  Temp(Src) 97.4 F (36.3 C) (Oral)  Wt 199 lb 12.8 oz (90.629 kg)  BMI 37.77 kg/m2 M obese   SKIN: warm and  Dry without overt rashes, tattoos and scars  HEAD and Neck: without JVD, Head and scalp: normal Eyes:No scleral icterus. Fundi normal, eye movements normal. Ears: Auricle normal, canal normal, Tympanic membranes normal, insufflation normal. Nose: normal Throat: normal Neck & thyroid: normal  CHEST & LUNGS: Chest wall: normal Lungs: Clear  CVS: Reveals the PMI to be normally located. Regular rhythm, First and Second Heart sounds are normal,  absence of murmurs, rubs or gallops. Peripheral vasculature: Radial pulses: normal Dorsal pedis pulses: normal Posterior pulses: normal  ABDOMEN:  Appearance: Obese Benign, no organomegaly, no masses, no Abdominal Aortic enlargement. No Guarding , no rebound. No Bruits. Bowel sounds: normal  RECTAL: [name] GU: [name]  EXTREMETIES: nonedematous. Both Femoral and Pedal pulses are normal.  MUSCULOSKELETAL:  Spine: normal Joints: intact  NEUROLOGIC: oriented to time,place and person; nonfocal. Strength is normal Sensory is normal Reflexes are normal Cranial Nerves are normal.  ASSESSMENT: Unspecified essential hypertension - Plan: COMPLETE METABOLIC PANEL WITH GFR  HYPERLIPIDEMIA - Plan: NMR Lipoprofile with Lipids  DIABETES MELLITUS, TYPE II - Plan: POCT  glycosylated hemoglobin (Hb A1C), COMPLETE METABOLIC PANEL WITH GFR  PLAN:  Orders Placed This Encounter  Procedures  . COMPLETE METABOLIC PANEL WITH GFR  . NMR Lipoprofile with Lipids  . POCT glycosylated hemoglobin (Hb A1C)   Meds ordered this encounter  Medications  . clobetasol cream (TEMOVATE) 0.05 %    Sig:   . NOVOLOG MIX 70/30 FLEXPEN (70-30) 100 UNIT/ML SUPN    Sig:   . TRADJENTA 5 MG TABS tablet    Sig: 5 mg daily.    Diet and  Exercise discussed..  Encouraged weight loss.  Return in about 3 months (around 12/24/2012).   Aneeka Bowden P. [place] Charon, M.D.

## 2012-09-24 LAB — COMPLETE METABOLIC PANEL WITH GFR
ALT: 19 U/L (ref 0–53)
AST: 23 U/L (ref 0–37)
Albumin: 4.2 g/dL (ref 3.5–5.2)
Alkaline Phosphatase: 35 U/L — ABNORMAL LOW (ref 39–117)
BUN: 27 mg/dL — ABNORMAL HIGH (ref 6–23)
CO2: 23 mEq/L (ref 19–32)
Calcium: 9.5 mg/dL (ref 8.4–10.5)
Chloride: 105 mEq/L (ref 96–112)
Creat: 2.06 mg/dL — ABNORMAL HIGH (ref 0.50–1.35)
GFR, Est African American: 36 mL/min — ABNORMAL LOW
GFR, Est Non African American: 31 mL/min — ABNORMAL LOW
Glucose, Bld: 189 mg/dL — ABNORMAL HIGH (ref 70–99)
Potassium: 5.6 mEq/L — ABNORMAL HIGH (ref 3.5–5.3)
Sodium: 136 mEq/L (ref 135–145)
Total Bilirubin: 0.3 mg/dL (ref 0.3–1.2)
Total Protein: 7.3 g/dL (ref 6.0–8.3)

## 2012-09-24 LAB — NMR LIPOPROFILE WITH LIPIDS
Cholesterol, Total: 173 mg/dL (ref <200)
HDL Particle Number: 28.6 umol/L — ABNORMAL LOW (ref >=30.5)
HDL Size: 8.5 nm — ABNORMAL LOW (ref >=9.2)
HDL-C: 32 mg/dL — ABNORMAL LOW (ref >=40)
LDL (calc): 106 mg/dL — ABNORMAL HIGH (ref <100)
LDL Particle Number: 1908 nmol/L — ABNORMAL HIGH (ref <1000)
LDL Size: 20.2 nm — ABNORMAL LOW (ref >20.5)
LP-IR Score: 74 — ABNORMAL HIGH (ref <=45)
Large HDL-P: 1.9 umol/L — ABNORMAL LOW (ref >=4.8)
Large VLDL-P: 1.4 nmol/L (ref <=2.7)
Small LDL Particle Number: 1334 nmol/L — ABNORMAL HIGH (ref <=527)
Triglycerides: 177 mg/dL — ABNORMAL HIGH (ref <150)
VLDL Size: 58.3 nm — ABNORMAL HIGH (ref <=46.6)

## 2012-09-25 ENCOUNTER — Other Ambulatory Visit: Payer: Self-pay | Admitting: Family Medicine

## 2012-09-25 DIAGNOSIS — E875 Hyperkalemia: Secondary | ICD-10-CM

## 2012-09-25 DIAGNOSIS — N189 Chronic kidney disease, unspecified: Secondary | ICD-10-CM

## 2012-09-25 DIAGNOSIS — E785 Hyperlipidemia, unspecified: Secondary | ICD-10-CM

## 2012-09-25 MED ORDER — PRAVASTATIN SODIUM 80 MG PO TABS: 80.0000 mg | ORAL_TABLET | Freq: Every day | ORAL | Status: DC

## 2012-09-25 NOTE — Progress Notes (Signed)
Quick Note:  Labs abnormal. Patient informed to come to get stat labs. He won't come until Monday. His potassium was elevated. Discussed risk to his life if high potassium is real . (we have several cases of labs with high potassium today and it may be lab error) Also will need to increase the pravastatin to achieve goals. Orders in EPIC. This was done via A phone call. ______

## 2012-09-25 NOTE — Progress Notes (Signed)
Patient informed on his labs. Not at goal and has CKD and elevated K= which may be lab error but imperative that we check. He wont come until Monday. Orders in EPIC.

## 2012-09-27 ENCOUNTER — Encounter (HOSPITAL_COMMUNITY): Payer: Self-pay

## 2012-09-27 ENCOUNTER — Telehealth: Payer: Self-pay | Admitting: Family Medicine

## 2012-09-27 ENCOUNTER — Other Ambulatory Visit (INDEPENDENT_AMBULATORY_CARE_PROVIDER_SITE_OTHER): Payer: Medicare HMO

## 2012-09-27 ENCOUNTER — Other Ambulatory Visit: Payer: Self-pay | Admitting: Family Medicine

## 2012-09-27 ENCOUNTER — Emergency Department (HOSPITAL_COMMUNITY)
Admission: EM | Admit: 2012-09-27 | Discharge: 2012-09-27 | Disposition: A | Discharge status: Home / Self Care | Payer: Medicare HMO | Attending: Emergency Medicine | Admitting: Emergency Medicine

## 2012-09-27 DIAGNOSIS — Z87891 Personal history of nicotine dependence: Secondary | ICD-10-CM | POA: Insufficient documentation

## 2012-09-27 DIAGNOSIS — Z7982 Long term (current) use of aspirin: Secondary | ICD-10-CM | POA: Insufficient documentation

## 2012-09-27 DIAGNOSIS — E119 Type 2 diabetes mellitus without complications: Secondary | ICD-10-CM | POA: Insufficient documentation

## 2012-09-27 DIAGNOSIS — Z8719 Personal history of other diseases of the digestive system: Secondary | ICD-10-CM | POA: Insufficient documentation

## 2012-09-27 DIAGNOSIS — Z79899 Other long term (current) drug therapy: Secondary | ICD-10-CM | POA: Insufficient documentation

## 2012-09-27 DIAGNOSIS — N189 Chronic kidney disease, unspecified: Secondary | ICD-10-CM

## 2012-09-27 DIAGNOSIS — Z794 Long term (current) use of insulin: Secondary | ICD-10-CM | POA: Insufficient documentation

## 2012-09-27 DIAGNOSIS — E875 Hyperkalemia: Secondary | ICD-10-CM

## 2012-09-27 DIAGNOSIS — E785 Hyperlipidemia, unspecified: Secondary | ICD-10-CM | POA: Insufficient documentation

## 2012-09-27 DIAGNOSIS — I129 Hypertensive chronic kidney disease with stage 1 through stage 4 chronic kidney disease, or unspecified chronic kidney disease: Secondary | ICD-10-CM | POA: Insufficient documentation

## 2012-09-27 LAB — BASIC METABOLIC PANEL WITH GFR
BUN: 32 mg/dL — ABNORMAL HIGH (ref 6–23)
CO2: 24 mEq/L (ref 19–32)
Calcium: 10.2 mg/dL (ref 8.4–10.5)
Chloride: 107 mEq/L (ref 96–112)
Creat: 2.63 mg/dL — ABNORMAL HIGH (ref 0.50–1.35)
GFR, Est African American: 27 mL/min — ABNORMAL LOW
GFR, Est Non African American: 23 mL/min — ABNORMAL LOW
Glucose, Bld: 120 mg/dL — ABNORMAL HIGH (ref 70–99)
Potassium: 6.1 mEq/L (ref 3.5–5.3)
Sodium: 137 mEq/L (ref 135–145)

## 2012-09-27 LAB — BASIC METABOLIC PANEL
BUN: 32 mg/dL — ABNORMAL HIGH (ref 6–23)
Chloride: 104 mEq/L (ref 96–112)
GFR calc Af Amer: 27 mL/min — ABNORMAL LOW (ref >90)
GFR calc non Af Amer: 24 mL/min — ABNORMAL LOW (ref >90)
Potassium: 5.4 mEq/L — ABNORMAL HIGH (ref 3.5–5.1)
Sodium: 137 mEq/L (ref 135–145)

## 2012-09-27 LAB — CBC WITH DIFFERENTIAL/PLATELET
Basophils Relative: 0 % (ref 0–1)
HCT: 38.5 % — ABNORMAL LOW (ref 39.0–52.0)
Hemoglobin: 12.6 g/dL — ABNORMAL LOW (ref 13.0–17.0)
Lymphocytes Relative: 32 % (ref 12–46)
Lymphs Abs: 1.5 10*3/uL (ref 0.7–4.0)
Monocytes Absolute: 0.4 10*3/uL (ref 0.1–1.0)
Monocytes Relative: 9 % (ref 3–12)
Neutro Abs: 1.8 10*3/uL (ref 1.7–7.7)
Neutrophils Relative %: 39 % — ABNORMAL LOW (ref 43–77)
RBC: 4.04 MIL/uL — ABNORMAL LOW (ref 4.22–5.81)
WBC: 4.6 10*3/uL (ref 4.0–10.5)

## 2012-09-27 MED ORDER — SODIUM POLYSTYRENE SULFONATE 15 GM/60ML PO SUSP
30.0000 g | Freq: Once | ORAL | Status: AC
Start: 2012-09-27 — End: 2012-09-27
  Administered 2012-09-27: 30 g via ORAL
  Filled 2012-09-27: qty 120

## 2012-09-27 NOTE — ED Notes (Signed)
Pt had blood work done this morning and  reports PCP called him today and told him to come to ED because potassium level was too high.  Pt reports has had frequent headaches recently.

## 2012-09-27 NOTE — Telephone Encounter (Signed)
Pt notified and aware of results

## 2012-09-27 NOTE — ED Provider Notes (Signed)
History    CSN: [id number] Arrival date & time 09/27/12  1818  First MD Initiated Contact with Patient 09/27/12 1913     Chief Complaint  Patient presents with  . hyperkalemia    (Consider location/radiation/quality/duration/timing/severity/associated sxs/prior Treatment) HPI This asymptomatic 72 year old male had routine blood draw at a routine clinic visit last week and had potassium level 6.1 but it is unknown whether or not the sample was hemolyzed so the patient was instructed by his primary care Dr. to come to the emergency department to have a potassium level checked. There is no treatment prior to arrival. He has no chest pain shortness breath abdominal pain vomiting confusion or other concerns. He does have diabetes hypertension and chronic kidney disease. Past Medical History  Diagnosis Date  . Hypertension   . Hyperlipidemia   . Diabetes mellitus   . Diverticulosis    Past Surgical History  Procedure Laterality Date  . Cataract extraction w/ intraocular lens  implant, bilateral  2012  . Knee surgery      ORIF LEFT KNEE    No family history on file. History  Substance Use Topics  . Smoking status: Former Smoker    Quit date: 03/11/2007  . Smokeless tobacco: Never Used  . Alcohol Use: No    Review of Systems 10 Systems reviewed and are negative for acute change except as noted in the HPI. Allergies  Review of patient's allergies indicates no known allergies.  Home Medications   Current Outpatient Rx  Name  Route  Sig  Dispense  Refill  . aspirin 81 MG tablet   Oral   Take 81 mg by mouth daily.           . benazepril (LOTENSIN) 10 MG tablet   Oral   Take 5 mg by mouth daily.          . cholecalciferol (VITAMIN D) 1000 UNITS tablet   Oral   Take 2,000 Units by mouth every morning.         . fenofibrate (TRICOR) 145 MG tablet   Oral   Take 145 mg by mouth daily.         . insulin NPH-insulin regular (NOVOLIN 70/30) (70-30) 100 UNIT/ML  injection   Subcutaneous   Inject 20-30 Units into the skin 2 (two) times daily at 10 AM and 5 PM. 30 units in the morning and 20 units in the evening         . Omega-3 300 MG CAPS   Oral   Take 1 capsule by mouth 2 (two) times daily.         Bertram Gala Glycol-Propyl Glycol (SYSTANE ULTRA OP)   Ophthalmic   Apply 1 drop to eye as needed. Dry eyes         . pravastatin (PRAVACHOL) 80 MG tablet   Oral   Take 1 tablet (80 mg total) by mouth at bedtime.   30 tablet   5   . TRADJENTA 5 MG TABS tablet   Oral   Take 5 mg by mouth daily.           BP 117/70  Pulse 72  Temp(Src) 98.2 F (36.8 C) (Oral)  Resp 18  Ht 6\' 1"  (1.854 m)  Wt 190 lb (86.183 kg)  BMI 25.07 kg/m2  SpO2 99% Physical Exam  Nursing note and vitals reviewed. Constitutional:  Awake, alert, nontoxic appearance.  HENT:  Head: Atraumatic.  Eyes: Right eye exhibits no discharge. Left eye exhibits  no discharge.  Neck: Neck supple.  Cardiovascular: Normal rate and regular rhythm.   No murmur heard. Pulmonary/Chest: Effort normal and breath sounds normal. No respiratory distress. He has no wheezes. He has no rales. He exhibits no tenderness.  Abdominal: Soft. Bowel sounds are normal. He exhibits no distension. There is no tenderness. There is no rebound and no guarding.  Musculoskeletal: He exhibits no edema and no tenderness.  Baseline ROM, no obvious new focal weakness.  Neurological: He is alert.  Mental status and motor strength appears baseline for patient and situation.  Skin: No rash noted.  Psychiatric: He has a normal mood and affect.    ED Course  Procedures (including critical care time) ECG: Normal sinus rhythm, ventricular rate 82, axis deviation, no acute ischemic changes noted, no peaked T waves noted, no comparison ECG immediately available Patient / Family / Caregiver informed of clinical course, understand medical decision-making process, and agree with plan. Labs Reviewed  BASIC  METABOLIC PANEL - Abnormal; Notable for the following:    Potassium 5.4 (*)    Glucose, Bld 119 (*)    BUN 32 (*)    Creatinine, Ser 2.52 (*)    GFR calc non Af Amer 24 (*)    GFR calc Af Amer 27 (*)    All other components within normal limits  CBC WITH DIFFERENTIAL - Abnormal; Notable for the following:    RBC 4.04 (*)    Hemoglobin 12.6 (*)    HCT 38.5 (*)    Neutrophils Relative % 39 (*)    Eosinophils Relative 20 (*)    Eosinophils Absolute 0.9 (*)    All other components within normal limits   No results found. 1. Hyperkalemia   2. Chronic renal insufficiency, unspecified stage     MDM  I doubt any other EMC precluding discharge at this time including, but not necessarily limited to the following:severe hyperkalemia.  Hurman Horn, MD 09/29/12 [phone]

## 2012-09-27 NOTE — ED Notes (Signed)
Patient placed on cardiac monitor with continuous pulse oximetry monitoring. Complains of headache at this time, denies any other complaints.

## 2012-09-27 NOTE — Progress Notes (Signed)
Quick Note:  Labs abnormal. Potassium is high. He has CKD and DM on Insulin. He needs to be evaluated tonight in the Emergency room for evaluation, EKG and possible Kayexalate. For evening nurse to continue attempts to contact. I called the cell phone 3 x this afternoon and left message to call back once. Thanks. ______

## 2012-09-29 ENCOUNTER — Other Ambulatory Visit: Payer: Self-pay | Admitting: Family Medicine

## 2012-09-30 ENCOUNTER — Other Ambulatory Visit (INDEPENDENT_AMBULATORY_CARE_PROVIDER_SITE_OTHER): Payer: Medicare HMO

## 2012-09-30 DIAGNOSIS — R7989 Other specified abnormal findings of blood chemistry: Secondary | ICD-10-CM

## 2012-10-01 LAB — BASIC METABOLIC PANEL WITH GFR
BUN: 34 mg/dL — ABNORMAL HIGH (ref 6–23)
CO2: 23 mEq/L (ref 19–32)
Calcium: 9.7 mg/dL (ref 8.4–10.5)
Chloride: 106 mEq/L (ref 96–112)
Creat: 2.32 mg/dL — ABNORMAL HIGH (ref 0.50–1.35)
GFR, Est African American: 31 mL/min — ABNORMAL LOW
GFR, Est Non African American: 27 mL/min — ABNORMAL LOW
Glucose, Bld: 125 mg/dL — ABNORMAL HIGH (ref 70–99)
Potassium: 4.9 mEq/L (ref 3.5–5.3)
Sodium: 136 mEq/L (ref 135–145)

## 2012-10-02 ENCOUNTER — Telehealth: Payer: Self-pay | Admitting: Family Medicine

## 2012-10-05 NOTE — Telephone Encounter (Signed)
See note from MMM. Patient needs to continue medications and is supposed to see Dr Fransico Him for uncontrolled DM. If he doesn't go to see Dr Fransico Him he then needs to come to see the clinical Pharmacists.  Thanks.

## 2012-10-05 NOTE — Telephone Encounter (Signed)
Sp.oke with Marylene Land and message below given

## 2012-10-25 ENCOUNTER — Other Ambulatory Visit: Payer: Self-pay | Admitting: Family Medicine

## 2012-11-19 ENCOUNTER — Other Ambulatory Visit: Payer: Self-pay | Admitting: Family Medicine

## 2012-12-24 ENCOUNTER — Ambulatory Visit (INDEPENDENT_AMBULATORY_CARE_PROVIDER_SITE_OTHER): Payer: Medicare HMO | Admitting: Family Medicine

## 2012-12-24 ENCOUNTER — Encounter: Payer: Self-pay | Admitting: Family Medicine

## 2012-12-24 VITALS — BP 127/73 | HR 68 | Temp 97.7°F | Ht 73.0 in | Wt 186.6 lb

## 2012-12-24 DIAGNOSIS — I10 Essential (primary) hypertension: Secondary | ICD-10-CM

## 2012-12-24 DIAGNOSIS — L309 Dermatitis, unspecified: Secondary | ICD-10-CM | POA: Insufficient documentation

## 2012-12-24 DIAGNOSIS — L259 Unspecified contact dermatitis, unspecified cause: Secondary | ICD-10-CM

## 2012-12-24 DIAGNOSIS — N189 Chronic kidney disease, unspecified: Secondary | ICD-10-CM | POA: Insufficient documentation

## 2012-12-24 DIAGNOSIS — Z23 Encounter for immunization: Secondary | ICD-10-CM | POA: Insufficient documentation

## 2012-12-24 DIAGNOSIS — N184 Chronic kidney disease, stage 4 (severe): Secondary | ICD-10-CM

## 2012-12-24 DIAGNOSIS — E119 Type 2 diabetes mellitus without complications: Secondary | ICD-10-CM

## 2012-12-24 DIAGNOSIS — E785 Hyperlipidemia, unspecified: Secondary | ICD-10-CM

## 2012-12-24 DIAGNOSIS — E559 Vitamin D deficiency, unspecified: Secondary | ICD-10-CM | POA: Insufficient documentation

## 2012-12-24 LAB — POCT GLYCOSYLATED HEMOGLOBIN (HGB A1C): Hemoglobin A1C: 7

## 2012-12-24 MED ORDER — CLOBETASOL PROPIONATE 0.05 % EX OINT: TOPICAL_OINTMENT | Freq: Two times a day (BID) | CUTANEOUS | Status: DC

## 2012-12-24 NOTE — Progress Notes (Signed)
Patient ID: Barry Welch, male   DOB: [birth year], 72 y.o.   MRN: [id number] SUBJECTIVE: CC: Chief Complaint  Patient presents with  . Follow-up    3  month follow up     HPI: Patient is here for follow up of Diabetes Mellitus/HLD/HTN/CKD: Symptoms evaluated: Denies Nocturia ,Denies Urinary Frequency , denies Blurred vision ,deniesDizziness,denies.Dysuria,denies paresthesias, denies extremity pain or ulcers.Marland Kitchendenies chest pain. has had an annual eye exam. do check the feet. Does check CBGs. Average CBG:91-150 Denies episodes of hypoglycemia. Does have an emergency hypoglycemic plan. admits toCompliance with medications. Denies Problems with medications.  Past Medical History  Diagnosis Date  . Hypertension   . Hyperlipidemia   . Diabetes mellitus   . Diverticulosis   . CKD (chronic kidney disease)    Past Surgical History  Procedure Laterality Date  . Cataract extraction w/ intraocular lens  implant, bilateral  2012  . Knee surgery      ORIF LEFT KNEE    History   Social History  . Marital Status: Legally Separated    Spouse Name: [name]    Number of Children: [name]  . Years of Education: [name]   Occupational History  . Not on file.   Social History Main Topics  . Smoking status: Former Smoker    Quit date: 03/11/2007  . Smokeless tobacco: Never Used  . Alcohol Use: No  . Drug Use: No  . Sexual Activity: Not on file   Other Topics Concern  . Not on file   Social History Narrative   DISABLED X 12 YRS    SEPARATED X 12 YRS     5 CHILDREN         No family history on file. Current Outpatient Prescriptions on File Prior to Visit  Medication Sig Dispense Refill  . aspirin 81 MG tablet Take 81 mg by mouth daily.        . benazepril (LOTENSIN) 10 MG tablet TAKE (1/2) TABLET DAILY.  30 tablet  4  . cholecalciferol (VITAMIN D) 1000 UNITS tablet Take 2,000 Units by mouth every morning.      . fenofibrate (TRICOR) 145 MG tablet TAKE 1 TABLET DAILY  30 tablet  3  .  insulin NPH-insulin regular (NOVOLIN 70/30) (70-30) 100 UNIT/ML injection Inject 20-30 Units into the skin 2 (two) times daily at 10 AM and 5 PM. 30 units in the morning and 20 units in the evening      . Omega-3 300 MG CAPS Take 1 capsule by mouth 2 (two) times daily.      Bertram Gala Glycol-Propyl Glycol (SYSTANE ULTRA OP) Apply 1 drop to eye as needed. Dry eyes      . pravastatin (PRAVACHOL) 80 MG tablet Take 1 tablet (80 mg total) by mouth at bedtime.  30 tablet  5  . TRADJENTA 5 MG TABS tablet Take 5 mg by mouth daily.        No current facility-administered medications on file prior to visit.   No Known Allergies Immunization History  Administered Date(s) Administered  . H1N1 03/21/2008  . Influenza Split 01/07/2011  . Influenza Whole 12/14/2008, 12/18/2009  . Influenza,inj,Quad PF,36+ Mos 12/24/2012  . Pneumococcal Polysaccharide 01/07/2011  . Tdap 09/06/2010   Prior to Admission medications   Medication Sig Start Date End Date Taking? Authorizing Provider  aspirin 81 MG tablet Take 81 mg by mouth daily.      Historical Provider, MD  benazepril (LOTENSIN) 10 MG tablet TAKE (1/2) TABLET DAILY. 10/25/12  Ileana Ladd, MD  cholecalciferol (VITAMIN D) 1000 UNITS tablet Take 2,000 Units by mouth every morning.    Historical Provider, MD  fenofibrate (TRICOR) 145 MG tablet Take 145 mg by mouth daily.    Historical Provider, MD  fenofibrate (TRICOR) 145 MG tablet TAKE 1 TABLET DAILY 11/19/12   Ileana Ladd, MD  insulin NPH-insulin regular (NOVOLIN 70/30) (70-30) 100 UNIT/ML injection Inject 20-30 Units into the skin 2 (two) times daily at 10 AM and 5 PM. 30 units in the morning and 20 units in the evening    Historical Provider, MD  Omega-3 300 MG CAPS Take 1 capsule by mouth 2 (two) times daily.    Historical Provider, MD  Polyethyl Glycol-Propyl Glycol (SYSTANE ULTRA OP) Apply 1 drop to eye as needed. Dry eyes    Historical Provider, MD  pravastatin (PRAVACHOL) 80 MG tablet Take 1  tablet (80 mg total) by mouth at bedtime. 09/25/12   Ileana Ladd, MD  TRADJENTA 5 MG TABS tablet Take 5 mg by mouth daily.  06/29/12   Historical Provider, MD     ROS: As above in the HPI. All other systems are stable or negative.  OBJECTIVE: APPEARANCE:  Patient in no acute distress.The patient appeared well nourished and normally developed. Acyanotic. Waist: VITAL SIGNS:BP 127/73  Pulse 68  Temp(Src) 97.7 F (36.5 C) (Oral)  Ht 6\' 1"  (1.854 m)  Wt 186 lb 9.6 oz (84.641 kg)  BMI 24.62 kg/m2 AAM  SKIN: warm and  Dry without overt rashes, tattoos and scars  HEAD and Neck: without JVD, Head and scalp: normal Eyes:No scleral icterus. Fundi normal, eye movements normal. Ears: Auricle normal, canal normal, Tympanic membranes normal, insufflation normal. Nose: normal Throat: normal Neck & thyroid: normal  CHEST & LUNGS: Chest wall: normal Lungs: Clear  CVS: Reveals the PMI to be normally located. Regular rhythm, First and Second Heart sounds are normal,  absence of murmurs, rubs or gallops. Peripheral vasculature: Radial pulses: normal Dorsal pedis pulses: normal Posterior pulses: normal  ABDOMEN:  Appearance: normal Benign, no organomegaly, no masses, no Abdominal Aortic enlargement. No Guarding , no rebound. No Bruits. Bowel sounds: normal  RECTAL: [name] GU: [name]  EXTREMETIES: nonedematous.  MUSCULOSKELETAL:  Spine: normal Joints: intact  NEUROLOGIC: oriented to time,place and person; nonfocal. Strength is normal Sensory is normal Reflexes are normal Cranial Nerves are normal.  ASSESSMENT: DIABETES MELLITUS, TYPE II - Plan: POCT glycosylated hemoglobin (Hb A1C), CMP14+EGFR  HYPERLIPIDEMIA - Plan: NMR, lipoprofile  Unspecified essential hypertension  Chronic kidney disease (CKD), stage IV (severe)  Need for prophylactic vaccination and inoculation against influenza  Unspecified vitamin D deficiency - Plan: Vit D  25 hydroxy (rtn osteoporosis  monitoring)  Dermatitis - Plan: clobetasol ointment (TEMOVATE) 0.05 %  PLAN:  Orders Placed This Encounter  Procedures  . CMP14+EGFR  . NMR, lipoprofile  . Vit D  25 hydroxy (rtn osteoporosis monitoring)  . POCT glycosylated hemoglobin (Hb A1C)   Meds ordered this encounter  Medications  . ACCU-CHEK COMPACT TEST DRUM test strip    Sig:   . NOVOLOG MIX 70/30 FLEXPEN (70-30) 100 UNIT/ML SUPN    Sig:   . NOVOFINE 30G X 8 MM MISC    Sig:   . clobetasol ointment (TEMOVATE) 0.05 %    Sig: Apply topically 2 (two) times daily.    Dispense:  30 g    Refill:  0   Medications Discontinued During This Encounter  Medication Reason  . fenofibrate (TRICOR)  145 MG tablet Duplicate  diet and healthy lifestyle discussed.  Return in about 3 months (around 03/26/2013) for Recheck medical problems.  Kayleen Alig P. [place] Charon, M.D.

## 2012-12-27 LAB — CMP14+EGFR
ALT: 17 IU/L (ref 0–44)
AST: 23 IU/L (ref 0–40)
Albumin/Globulin Ratio: 1.5 (ref 1.1–2.5)
Albumin: 4.3 g/dL (ref 3.5–4.8)
Alkaline Phosphatase: 36 IU/L — ABNORMAL LOW (ref 39–117)
BUN/Creatinine Ratio: 12 (ref 10–22)
BUN: 23 mg/dL (ref 8–27)
CO2: 22 mmol/L (ref 18–29)
Calcium: 9.7 mg/dL (ref 8.6–10.2)
Chloride: 101 mmol/L (ref 97–108)
Creatinine, Ser: 1.92 mg/dL — ABNORMAL HIGH (ref 0.76–1.27)
GFR calc Af Amer: 39 mL/min/{1.73_m2} — ABNORMAL LOW (ref >59)
GFR calc non Af Amer: 34 mL/min/{1.73_m2} — ABNORMAL LOW (ref >59)
Globulin, Total: 2.9 g/dL (ref 1.5–4.5)
Glucose: 142 mg/dL — ABNORMAL HIGH (ref 65–99)
Potassium: 5.2 mmol/L (ref 3.5–5.2)
Sodium: 138 mmol/L (ref 134–144)
Total Bilirubin: 0.3 mg/dL (ref 0.0–1.2)
Total Protein: 7.2 g/dL (ref 6.0–8.5)

## 2012-12-27 LAB — NMR, LIPOPROFILE
Cholesterol: 158 mg/dL (ref <200)
HDL Cholesterol by NMR: 35 mg/dL — ABNORMAL LOW (ref >=40)
HDL Particle Number: 30.4 umol/L — ABNORMAL LOW (ref >=30.5)
LDL Particle Number: 1820 nmol/L — ABNORMAL HIGH (ref <1000)
LDL Size: 20.3 nm — ABNORMAL LOW (ref >20.5)
LDLC SERPL CALC-MCNC: 105 mg/dL — ABNORMAL HIGH (ref <100)
LP-IR Score: 48 — ABNORMAL HIGH (ref <=45)
Small LDL Particle Number: 1277 nmol/L — ABNORMAL HIGH (ref <=527)
Triglycerides by NMR: 92 mg/dL (ref <150)

## 2012-12-27 LAB — VITAMIN D 25 HYDROXY (VIT D DEFICIENCY, FRACTURES): Vit D, 25-Hydroxy: 38.4 ng/mL (ref 30.0–100.0)

## 2013-01-12 ENCOUNTER — Ambulatory Visit (INDEPENDENT_AMBULATORY_CARE_PROVIDER_SITE_OTHER): Payer: Medicare HMO | Admitting: Pharmacist

## 2013-01-12 ENCOUNTER — Encounter: Payer: Self-pay | Admitting: Pharmacist

## 2013-01-12 VITALS — BP 108/68 | HR 67 | Ht 73.0 in | Wt 193.0 lb

## 2013-01-12 DIAGNOSIS — I10 Essential (primary) hypertension: Secondary | ICD-10-CM

## 2013-01-12 DIAGNOSIS — E785 Hyperlipidemia, unspecified: Secondary | ICD-10-CM

## 2013-01-12 DIAGNOSIS — E119 Type 2 diabetes mellitus without complications: Secondary | ICD-10-CM

## 2013-01-12 MED ORDER — ATORVASTATIN CALCIUM 40 MG PO TABS: 40.0000 mg | ORAL_TABLET | Freq: Every day | ORAL | Status: DC

## 2013-01-12 NOTE — Progress Notes (Signed)
Diabetes Follow-Up Visit Chief Complaint:   Chief Complaint  Patient presents with  . Diabetes  . Hyperlipidemia     Filed Vitals:   01/12/13 1608  BP: 108/68  Pulse: 67    HPI:   Patient with hyperlipidemia that is poorly controlled with current regimen of pravastatin 40mg  daily and fenofibrate 145mg  daily.  Last LDL-P was 1820 on 12/24/2012.  He also has DM and last A1c was 7.0%.  For diabetes he is taking tragdenta 5mg  daily and injects Novolog Mix 70/30 - 25 units each morning and 20 units each evening.  His caregiver states that is he BG is 91 or less though they hold his insulin dose.  She reports that she rarely has to hold the evening dose but commonly holds the morning dose about twice a week due to BG less than 91.   Patient also have a history of renal insufficiency.  Last Serum creatinine was 1.92.  Low fat/carbohydrate diet?  No Nicotine Abuse?  No Medication Compliance?  Yes Exercise?  No Alcohol Abuse?  No  Home BG Monitoring:  Checking 2 times a day. Average:  130  High: 155  Low:  80's     Lab Results  Component Value Date   HGBA1C 7.0% 12/24/2012    Lab Results  Component Value Date   MICROALBUR 0.50 05/24/2012    Lab Results  Component Value Date   CHOL 158 12/24/2012   HDL 34* 09/06/2010   LDLCALC 106* 09/23/2012   TRIG 177* 09/23/2012   CHOLHDL 4.7 09/06/2010      Assessment: 1.  Diabetes.   2.  Blood Pressure.  controlled 3.  Lipids.  Elevated LDL-P 4.  Renal insufficiency   Recommendations: 1.  Medication recommendations at this time are as follows:    Change novolog mix 70/30 to 25 unit each morning before breakfast and 18 units each evening before supper.  This change should help with lows in the am and decrease the number of times patient much hold am dose.    D/C pravstatin.  Start atorvastatin 40mg  daily 2.  Reviewed HBG goals:  Fasting 80-130 and 1-2 hour post prandial <180.  Patient is instructed to check BG 2 times per day.     3.  BP goal < 140/80. 4.  LDL goal of < 100, HDL > 40 and TG < 150. 5.  Eye Exam yearly and Dental Exam every 6 months. 6.  Dietary recommendations:  Reviewed CHO serving sizes and discussed limiting sugar in diet 7.  Physical Activity recommendations:  Walking as able 8.  Return to clinic in 2 months   Time spent counseling patient:  30 minutes   Referring provider:  Pam Drown, PharmD, CPP

## 2013-02-16 ENCOUNTER — Telehealth: Payer: Self-pay | Admitting: Family Medicine

## 2013-02-17 NOTE — Telephone Encounter (Signed)
Pt aware to pick up

## 2013-03-21 ENCOUNTER — Telehealth: Payer: Self-pay | Admitting: Family Medicine

## 2013-03-21 NOTE — Telephone Encounter (Signed)
No samples in office patient aware

## 2013-03-22 ENCOUNTER — Other Ambulatory Visit: Payer: Self-pay | Admitting: Family Medicine

## 2013-03-29 ENCOUNTER — Ambulatory Visit: Payer: Medicare HMO | Admitting: Family Medicine

## 2013-04-22 ENCOUNTER — Ambulatory Visit (INDEPENDENT_AMBULATORY_CARE_PROVIDER_SITE_OTHER): Payer: Medicare HMO | Admitting: Family Medicine

## 2013-04-22 ENCOUNTER — Encounter: Payer: Self-pay | Admitting: Family Medicine

## 2013-04-22 VITALS — BP 128/72 | HR 90 | Temp 98.1°F | Ht 73.0 in | Wt 195.8 lb

## 2013-04-22 DIAGNOSIS — E785 Hyperlipidemia, unspecified: Secondary | ICD-10-CM

## 2013-04-22 DIAGNOSIS — N184 Chronic kidney disease, stage 4 (severe): Secondary | ICD-10-CM

## 2013-04-22 DIAGNOSIS — E559 Vitamin D deficiency, unspecified: Secondary | ICD-10-CM

## 2013-04-22 DIAGNOSIS — E119 Type 2 diabetes mellitus without complications: Secondary | ICD-10-CM

## 2013-04-22 DIAGNOSIS — I10 Essential (primary) hypertension: Secondary | ICD-10-CM

## 2013-04-22 LAB — POCT GLYCOSYLATED HEMOGLOBIN (HGB A1C): Hemoglobin A1C: 8.9

## 2013-04-22 NOTE — Progress Notes (Signed)
Patient ID: Barry Welch, male   DOB: 1940/11/19, 73 y.o.   MRN: [id number] SUBJECTIVE: CC: Chief Complaint  Patient presents with  . Follow-up    3 month follow up chronic problems c/o cold symptoms dry cough    HPI: Patient is here for follow up of Diabetes Mellitus/HLD/HTN/CKD: Symptoms evaluated: Denies Nocturia ,Denies Urinary Frequency , denies Blurred vision ,deniesDizziness,denies.Dysuria,denies paresthesias, denies extremity pain or ulcers.Marland Kitchendenies chest pain. has had an annual eye exam. do check the feet. Does check CBGs. Average CBG:up and  Down. Can be 100 up to 200 Denies episodes of hypoglycemia. Does have an emergency hypoglycemic plan. admits toCompliance with medications. Denies Problems with medications.   Also has a head cold. Stuffiness of the nose.  Past Medical History  Diagnosis Date  . Hypertension   . Hyperlipidemia   . Diabetes mellitus   . Diverticulosis   . CKD (chronic kidney disease)    Past Surgical History  Procedure Laterality Date  . Cataract extraction w/ intraocular lens  implant, bilateral  2012  . Knee surgery      ORIF LEFT KNEE    History   Social History  . Marital Status: Legally Separated    Spouse Name: [name]    Number of Children: [name]  . Years of Education: [name]   Occupational History  . Not on file.   Social History Main Topics  . Smoking status: Former Smoker    Quit date: 03/11/2007  . Smokeless tobacco: Never Used  . Alcohol Use: No  . Drug Use: No  . Sexual Activity: Not on file   Other Topics Concern  . Not on file   Social History Narrative   DISABLED X 12 YRS    SEPARATED X 12 YRS     5 CHILDREN         No family history on file. Current Outpatient Prescriptions on File Prior to Visit  Medication Sig Dispense Refill  . ACCU-CHEK COMPACT TEST DRUM test strip       . aspirin 81 MG tablet Take 81 mg by mouth daily.        Marland Kitchen atorvastatin (LIPITOR) 40 MG tablet Take 1 tablet (40 mg total) by mouth  daily.  30 tablet  2  . benazepril (LOTENSIN) 10 MG tablet TAKE (1/2) TABLET DAILY.  30 tablet  4  . cholecalciferol (VITAMIN D) 1000 UNITS tablet Take 2,000 Units by mouth every morning.      . clobetasol ointment (TEMOVATE) 0.05 % Apply topically 2 (two) times daily.  30 g  0  . fenofibrate (TRICOR) 145 MG tablet TAKE 1 TABLET DAILY  30 tablet  2  . Insulin Aspart Prot & Aspart (NOVOLOG MIX 70/30 FLEXPEN) (70-30) 100 UNIT/ML SUPN Inject 25 units each morning before breakfast and 18 units each evening before supper.  15 mL  2  . NOVOFINE 30G X 8 MM MISC       . Omega-3 300 MG CAPS Take 1 capsule by mouth 2 (two) times daily.      Vladimir Faster Glycol-Propyl Glycol (SYSTANE ULTRA OP) Apply 1 drop to eye as needed. Dry eyes      . TRADJENTA 5 MG TABS tablet Take 5 mg by mouth daily.        No current facility-administered medications on file prior to visit.   No Known Allergies Immunization History  Administered Date(s) Administered  . H1N1 03/21/2008  . Influenza Split 01/07/2011  . Influenza Whole 12/14/2008, 12/18/2009  . Influenza,inj,Quad  PF,36+ Mos 12/24/2012  . Pneumococcal Polysaccharide-23 01/07/2011  . Tdap 09/06/2010   Prior to Admission medications   Medication Sig Start Date End Date Taking? Authorizing Provider  ACCU-CHEK COMPACT TEST DRUM test strip  12/20/12   Historical Provider, MD  aspirin 81 MG tablet Take 81 mg by mouth daily.      Historical Provider, MD  atorvastatin (LIPITOR) 40 MG tablet Take 1 tablet (40 mg total) by mouth daily. 01/12/13   Tammy Eckard, PHARMD  benazepril (LOTENSIN) 10 MG tablet TAKE (1/2) TABLET DAILY. 10/25/12   Vernie Shanks, MD  cholecalciferol (VITAMIN D) 1000 UNITS tablet Take 2,000 Units by mouth every morning.    Historical Provider, MD  clobetasol ointment (TEMOVATE) 0.05 % Apply topically 2 (two) times daily. 12/24/12   Vernie Shanks, MD  fenofibrate (TRICOR) 145 MG tablet TAKE 1 TABLET DAILY 03/22/13   Vernie Shanks, MD  Insulin  Aspart Prot & Aspart (NOVOLOG MIX 70/30 FLEXPEN) (70-30) 100 UNIT/ML SUPN Inject 25 units each morning before breakfast and 18 units each evening before supper. 01/12/13   Tammy Eckard, PHARMD  NOVOFINE 30G X 8 MM MISC  11/19/12   Historical Provider, MD  Omega-3 300 MG CAPS Take 1 capsule by mouth 2 (two) times daily.    Historical Provider, MD  Polyethyl Glycol-Propyl Glycol (SYSTANE ULTRA OP) Apply 1 drop to eye as needed. Dry eyes    Historical Provider, MD  TRADJENTA 5 MG TABS tablet Take 5 mg by mouth daily.  06/29/12   Historical Provider, MD     ROS: As above in the HPI. All other systems are stable or negative.  OBJECTIVE: APPEARANCE:  Patient in no acute distress.The patient appeared well nourished and normally developed. Acyanotic. Waist: VITAL SIGNS:BP 128/72  Pulse 90  Temp(Src) 98.1 F (36.7 C) (Oral)  Ht 6' 1" (1.854 m)  Wt 195 lb 12.8 oz (88.814 kg)  BMI 25.84 kg/m2 AAM  SKIN: warm and  Dry without overt rashes, tattoos and scars  HEAD and Neck: without JVD, Head and scalp: normal Eyes:No scleral icterus. Fundi normal, eye movements normal. Ears: Auricle normal, canal normal, Tympanic membranes normal, insufflation normal. Nose: nasal congestion Throat: normal Neck & thyroid: normal  CHEST & LUNGS: Chest wall: normal Lungs: Clear  CVS: Reveals the PMI to be normally located. Regular rhythm, First and Second Heart sounds are normal,  absence of murmurs, rubs or gallops. Peripheral vasculature: Radial pulses: normal Dorsal pedis pulses: normal Posterior pulses: normal  ABDOMEN:  Appearance: normal Benign, no organomegaly, no masses, no Abdominal Aortic enlargement. No Guarding , no rebound. No Bruits. Bowel sounds: normal  RECTAL: [name] GU: [name]  EXTREMETIES: nonedematous.  MUSCULOSKELETAL:  Spine: normal Joints: intact  NEUROLOGIC: oriented to time,place and person; nonfocal. Strength is normal Sensory is normal Reflexes are normal Cranial  Nerves are normal.  ASSESSMENT: Unspecified essential hypertension - Plan: CMP14+EGFR  HYPERLIPIDEMIA - Plan: CMP14+EGFR, NMR, lipoprofile  DIABETES MELLITUS, TYPE II - Plan: POCT glycosylated hemoglobin (Hb A1C), CMP14+EGFR  Chronic kidney disease (CKD), stage IV (severe)  Unspecified vitamin D deficiency - Plan: Vit D  25 hydroxy (rtn osteoporosis monitoring)  PLAN: DM foot care DM annual eye exam       Dr Paula Libra Recommendations  For nutrition information, I recommend books:  1).Eat to Live by Dr Excell Seltzer. 2).Prevent and Reverse Heart Disease by Dr Karl Luke. 3) Dr Janene Harvey Book:  Program to Reverse Diabetes  Exercise recommendations are:  If unable  to walk, then the patient can exercise in a chair 3 times a day. By flapping arms like a bird gently and raising legs outwards to the front.  If ambulatory, the patient can go for walks for 30 minutes 3 times a week. Then increase the intensity and duration as tolerated.  Goal is to try to attain exercise frequency to 5 times a week.  If applicable: Best to perform resistance exercises (machines or weights) 2 days a week and cardio type exercises 3 days per week.  Orders Placed This Encounter  Procedures  . CMP14+EGFR  . NMR, lipoprofile  . Vit D  25 hydroxy (rtn osteoporosis monitoring)  . POCT glycosylated hemoglobin (Hb A1C)   No orders of the defined types were placed in this encounter.   There are no discontinued medications. Return in about 3 months (around 07/20/2013) for Recheck medical problems.  Francis P. Jacelyn Grip, M.D.

## 2013-04-22 NOTE — Patient Instructions (Signed)
Diabetes and Foot Care Diabetes may cause you to have problems because of poor blood supply (circulation) to your feet and legs. This may cause the skin on your feet to become thinner, break easier, and heal more slowly. Your skin may become dry, and the skin may peel and crack. You may also have nerve damage in your legs and feet causing decreased feeling in them. You may not notice minor injuries to your feet that could lead to infections or more serious problems. Taking care of your feet is one of the most important things you can do for yourself.  HOME CARE INSTRUCTIONS  Wear shoes at all times, even in the house. Do not go barefoot. Bare feet are easily injured.  Check your feet daily for blisters, cuts, and redness. If you cannot see the bottom of your feet, use a mirror or ask someone for help.  Wash your feet with warm water (do not use hot water) and mild soap. Then pat your feet and the areas between your toes until they are completely dry. Do not soak your feet as this can dry your skin.  Apply a moisturizing lotion or petroleum jelly (that does not contain alcohol and is unscented) to the skin on your feet and to dry, brittle toenails. Do not apply lotion between your toes.  Trim your toenails straight across. Do not dig under them or around the cuticle. File the edges of your nails with an emery board or nail file.  Do not cut corns or calluses or try to remove them with medicine.  Wear clean socks or stockings every day. Make sure they are not too tight. Do not wear knee-high stockings since they may decrease blood flow to your legs.  Wear shoes that fit properly and have enough cushioning. To break in new shoes, wear them for just a few hours a day. This prevents you from injuring your feet. Always look in your shoes before you put them on to be sure there are no objects inside.  Do not cross your legs. This may decrease the blood flow to your feet.  If you find a minor scrape,  cut, or break in the skin on your feet, keep it and the skin around it clean and dry. These areas may be cleansed with mild soap and water. Do not cleanse the area with peroxide, alcohol, or iodine.  When you remove an adhesive bandage, be sure not to damage the skin around it.  If you have a wound, look at it several times a day to make sure it is healing.  Do not use heating pads or hot water bottles. They may burn your skin. If you have lost feeling in your feet or legs, you may not know it is happening until it is too late.  Make sure your health care provider performs a complete foot exam at least annually or more often if you have foot problems. Report any cuts, sores, or bruises to your health care provider immediately. SEEK MEDICAL CARE IF:   You have an injury that is not healing.  You have cuts or breaks in the skin.  You have an ingrown nail.  You notice redness on your legs or feet.  You feel burning or tingling in your legs or feet.  You have pain or cramps in your legs and feet.  Your legs or feet are numb.  Your feet always feel cold. SEEK IMMEDIATE MEDICAL CARE IF:   There is increasing redness,   swelling, or pain in or around a wound.  There is a red line that goes up your leg.  Pus is coming from a wound.  You develop a fever or as directed by your health care provider.  You notice a bad smell coming from an ulcer or wound. Document Released: 02/22/2000 Document Revised: 10/27/2012 Document Reviewed: 08/03/2012 ExitCare Patient Information 2014 [employer].        Dr Agnieszka Newhouse's Recommendations  For nutrition information, I recommend books:  1).Eat to Live by Dr Joel Fuhrman. 2).Prevent and Reverse Heart Disease by Dr Caldwell Esselstyn. 3) Dr Neal Barnard's Book:  Program to Reverse Diabetes  Exercise recommendations are:  If unable to walk, then the patient can exercise in a chair 3 times a day. By flapping arms like a bird gently and  raising legs outwards to the front.  If ambulatory, the patient can go for walks for 30 minutes 3 times a week. Then increase the intensity and duration as tolerated.  Goal is to try to attain exercise frequency to 5 times a week.  If applicable: Best to perform resistance exercises (machines or weights) 2 days a week and cardio type exercises 3 days per week.  

## 2013-04-23 LAB — CMP14+EGFR
ALT: 19 IU/L (ref 0–44)
AST: 25 IU/L (ref 0–40)
Albumin/Globulin Ratio: 1.3 (ref 1.1–2.5)
Albumin: 3.9 g/dL (ref 3.5–4.8)
Alkaline Phosphatase: 49 IU/L (ref 39–117)
BUN/Creatinine Ratio: 7 — ABNORMAL LOW (ref 10–22)
BUN: 16 mg/dL (ref 8–27)
CO2: 24 mmol/L (ref 18–29)
Calcium: 9 mg/dL (ref 8.6–10.2)
Chloride: 101 mmol/L (ref 97–108)
Creatinine, Ser: 2.14 mg/dL — ABNORMAL HIGH (ref 0.76–1.27)
GFR calc Af Amer: 34 mL/min/{1.73_m2} — ABNORMAL LOW (ref >59)
GFR calc non Af Amer: 29 mL/min/{1.73_m2} — ABNORMAL LOW (ref >59)
Globulin, Total: 3.1 g/dL (ref 1.5–4.5)
Glucose: 302 mg/dL — ABNORMAL HIGH (ref 65–99)
Potassium: 5.4 mmol/L — ABNORMAL HIGH (ref 3.5–5.2)
Sodium: 139 mmol/L (ref 134–144)
Total Bilirubin: 0.3 mg/dL (ref 0.0–1.2)
Total Protein: 7 g/dL (ref 6.0–8.5)

## 2013-04-23 LAB — NMR, LIPOPROFILE
Cholesterol: 121 mg/dL (ref <200)
HDL Cholesterol by NMR: 27 mg/dL — ABNORMAL LOW (ref >=40)
HDL Particle Number: 23.9 umol/L — ABNORMAL LOW (ref >=30.5)
LDL Particle Number: 1261 nmol/L — ABNORMAL HIGH (ref <1000)
LDL Size: 20.3 nm — ABNORMAL LOW (ref >20.5)
LDLC SERPL CALC-MCNC: 74 mg/dL (ref <100)
LP-IR Score: 69 — ABNORMAL HIGH (ref <=45)
Small LDL Particle Number: 887 nmol/L — ABNORMAL HIGH (ref <=527)
Triglycerides by NMR: 101 mg/dL (ref <150)

## 2013-04-23 LAB — VITAMIN D 25 HYDROXY (VIT D DEFICIENCY, FRACTURES): Vit D, 25-Hydroxy: 29.3 ng/mL — ABNORMAL LOW (ref 30.0–100.0)

## 2013-04-24 ENCOUNTER — Other Ambulatory Visit: Payer: Self-pay | Admitting: Family Medicine

## 2013-04-24 DIAGNOSIS — N184 Chronic kidney disease, stage 4 (severe): Secondary | ICD-10-CM

## 2013-04-24 NOTE — Progress Notes (Signed)
Quick Note:  Call Patient Labs that are abnormal: Kidney disease worsening again. His DM is uncontrolled. His potassium is a little high His good HDLc is too low Vitamin d is borderline low.  Recommendations: Needs an appointment with Tammy or Sharyn Lull to review labs and adjust medications within 1 to 2 weeks. Would like him to see a kidney specialist for evaluation to avoid his kidneys from deteriorating more. Ordered in EPIC.   ______

## 2013-06-18 ENCOUNTER — Other Ambulatory Visit: Payer: Self-pay | Admitting: Family Medicine

## 2013-06-21 ENCOUNTER — Ambulatory Visit: Payer: Medicare HMO | Admitting: General Practice

## 2013-06-23 ENCOUNTER — Encounter: Payer: Self-pay | Admitting: Family Medicine

## 2013-06-23 ENCOUNTER — Ambulatory Visit (INDEPENDENT_AMBULATORY_CARE_PROVIDER_SITE_OTHER): Payer: Medicare HMO | Admitting: Family Medicine

## 2013-06-23 VITALS — BP 116/71 | HR 85 | Temp 97.1°F | Ht 73.0 in | Wt 191.0 lb

## 2013-06-23 DIAGNOSIS — I10 Essential (primary) hypertension: Secondary | ICD-10-CM

## 2013-06-23 DIAGNOSIS — E785 Hyperlipidemia, unspecified: Secondary | ICD-10-CM

## 2013-06-23 DIAGNOSIS — Z2911 Encounter for prophylactic immunotherapy for respiratory syncytial virus (RSV): Secondary | ICD-10-CM

## 2013-06-23 LAB — POCT CBC
Granulocyte percent: 43.2 % (ref 37–80)
HCT, POC: 41.3 % — AB (ref 43.5–53.7)
Hemoglobin: 12.9 g/dL — AB (ref 14.1–18.1)
Lymph, poc: 2.1 (ref 0.6–3.4)
MCH, POC: 29.9 pg (ref 27–31.2)
MCHC: 31.1 g/dL — AB (ref 31.8–35.4)
MCV: 96 fL (ref 80–97)
MPV: 5.8 fL (ref 0–99.8)
POC Granulocyte: 2.1 (ref 2–6.9)
POC LYMPH PERCENT: 42.9 % (ref 10–50)
Platelet Count, POC: 306 K/uL (ref 142–424)
RBC: 4.3 M/uL — AB (ref 4.69–6.13)
RDW, POC: 14.2 %
WBC: 4.9 K/uL (ref 4.6–10.2)

## 2013-06-23 NOTE — Addendum Note (Signed)
Addended by: Marin Olp on: 06/23/2013 09:20 AM   Modules accepted: Orders

## 2013-06-23 NOTE — Patient Instructions (Signed)
Herpes Zoster Virus Vaccine What is this medicine? HERPES ZOSTER VIRUS VACCINE (HUR peez ZOS ter vahy ruhs vak SEEN) is a vaccine. It is used to prevent shingles in adults 75 years old and over. This vaccine is not used to treat shingles or nerve pain from shingles. This medicine may be used for other purposes; ask your health care provider or pharmacist if you have questions. COMMON BRAND NAME(S): Zostavax What should I tell my health care provider before I take this medicine? They need to know if you have any of these conditions: -cancer like leukemia or lymphoma -immune system problems or therapy -infection with fever -tuberculosis -an unusual or allergic reaction to vaccines, neomycin, gelatin, other medicines, foods, dyes, or preservatives -pregnant or trying to get pregnant -breast-feeding How should I use this medicine? This vaccine is for injection under the skin. It is given by a health care professional. Talk to your pediatrician regarding the use of this medicine in children. This medicine is not approved for use in children. Overdosage: If you think you have taken too much of this medicine contact a poison control center or emergency room at once. NOTE: This medicine is only for you. Do not share this medicine with others. What if I miss a dose? This does not apply. What may interact with this medicine? Do not take this medicine with any of the following medications: -adalimumab -anakinra -etanercept -infliximab -medicines to treat cancer -medicines that suppress your immune system This medicine may also interact with the following medications: -immunoglobulins -steroid medicines like prednisone or cortisone This list may not describe all possible interactions. Give your health care provider a list of all the medicines, herbs, non-prescription drugs, or dietary supplements you use. Also tell them if you smoke, drink alcohol, or use illegal drugs. Some items may interact  with your medicine. What should I watch for while using this medicine? Visit your doctor for regular check ups. This vaccine, like all vaccines, may not fully protect everyone. After receiving this vaccine it may be possible to pass chickenpox infection to others. Avoid people with immune system problems, pregnant women who have not had chickenpox, and newborns of women who have not had chickenpox. Talk to your doctor for more information. What side effects may I notice from receiving this medicine? Side effects that you should report to your doctor or health care professional as soon as possible: -allergic reactions like skin rash, itching or hives, swelling of the face, lips, or tongue -breathing problems -feeling faint or lightheaded, falls -fever, flu-like symptoms -pain, tingling, numbness in the hands or feet -swelling of the ankles, feet, hands -unusually weak or tired Side effects that usually do not require medical attention (report to your doctor or health care professional if they continue or are bothersome): -aches or pains -chickenpox-like rash -diarrhea -headache -loss of appetite -nausea, vomiting -redness, pain, swelling at site where injected -runny nose This list may not describe all possible side effects. Call your doctor for medical advice about side effects. You may report side effects to FDA at 1-800-FDA-1088. Where should I keep my medicine? This drug is given in a hospital or clinic and will not be stored at home. NOTE: This sheet is a summary. It may not cover all possible information. If you have questions about this medicine, talk to your doctor, pharmacist, or health care provider.  2014, Elsevier/Gold Standard. (2009-08-13 17:43:50)  

## 2013-06-23 NOTE — Progress Notes (Signed)
   Subjective:    Patient ID: Barry Welch, male    DOB: 06-28-1940, 73 y.o.   MRN: [id number]  HPI This 73 y.o. male presents for evaluation of hypertension, hyperlipidemia, and diabetes. He sees endocrinologist in [place].  He has hx of CKD.  He has no acute complaints.   Review of Systems    No chest pain, SOB, HA, dizziness, vision change, N/V, diarrhea, constipation, dysuria, urinary urgency or frequency, myalgias, arthralgias or rash.  Objective:   Physical Exam Vital signs noted  Well developed well nourished elderly AA male in NAD.  HEENT - Head atraumatic Normocephalic                Eyes - PERRLA, Conjuctiva - clear Sclera- Clear EOMI                Ears - EAC's Wnl TM's Wnl Gross Hearing WNL                 Throat - oropharanx wnl Respiratory - Lungs CTA bilateral Cardiac - RRR S1 and S2 without murmur GI - Abdomen soft Nontender and bowel sounds active x 4 Extremities - No edema. Neuro - Grossly intact.       Assessment & Plan:  Accelerated hypertension - Plan: POCT CBC, CMP14+EGFR, Lipid panel  Other and unspecified hyperlipidemia - Plan: POCT CBC, CMP14+EGFR, Lipid panel  Diabetes - Follow up with Endocrinologist in [place].  He has seen his eye doctor this year  Lysbeth Penner FNP

## 2013-06-24 LAB — CMP14+EGFR
ALT: 18 IU/L (ref 0–44)
AST: 23 IU/L (ref 0–40)
Albumin/Globulin Ratio: 1.3 (ref 1.1–2.5)
Albumin: 4.2 g/dL (ref 3.5–4.8)
Alkaline Phosphatase: 40 IU/L (ref 39–117)
BUN/Creatinine Ratio: 14 (ref 10–22)
BUN: 26 mg/dL (ref 8–27)
CO2: 23 mmol/L (ref 18–29)
Calcium: 9.7 mg/dL (ref 8.6–10.2)
Chloride: 102 mmol/L (ref 97–108)
Creatinine, Ser: 1.91 mg/dL — ABNORMAL HIGH (ref 0.76–1.27)
GFR calc Af Amer: 39 mL/min/{1.73_m2} — ABNORMAL LOW (ref >59)
GFR calc non Af Amer: 34 mL/min/{1.73_m2} — ABNORMAL LOW (ref >59)
Globulin, Total: 3.2 g/dL (ref 1.5–4.5)
Glucose: 143 mg/dL — ABNORMAL HIGH (ref 65–99)
Potassium: 4.9 mmol/L (ref 3.5–5.2)
Sodium: 141 mmol/L (ref 134–144)
Total Bilirubin: 0.4 mg/dL (ref 0.0–1.2)
Total Protein: 7.4 g/dL (ref 6.0–8.5)

## 2013-06-24 LAB — LIPID PANEL
Chol/HDL Ratio: 4.1 ratio units (ref 0.0–5.0)
Cholesterol, Total: 140 mg/dL (ref 100–199)
HDL: 34 mg/dL — ABNORMAL LOW (ref >39)
LDL Calculated: 87 mg/dL (ref 0–99)
Triglycerides: 95 mg/dL (ref 0–149)
VLDL Cholesterol Cal: 19 mg/dL (ref 5–40)

## 2013-06-30 ENCOUNTER — Telehealth: Payer: Self-pay | Admitting: Family Medicine

## 2013-06-30 DIAGNOSIS — E119 Type 2 diabetes mellitus without complications: Secondary | ICD-10-CM

## 2013-07-04 NOTE — Telephone Encounter (Signed)
Call patient. He can come and get a free glucometer in the lab and when he is here we can write Rx for supplies for the glucometer. Thanks. Court Gracia P. Jacelyn Grip, M.D.

## 2013-07-06 ENCOUNTER — Other Ambulatory Visit: Payer: Self-pay | Admitting: *Deleted

## 2013-07-06 DIAGNOSIS — E119 Type 2 diabetes mellitus without complications: Secondary | ICD-10-CM

## 2013-07-06 MED ORDER — GLUCOSE BLOOD VI STRP: 1.0000 | ORAL_STRIP | Freq: Two times a day (BID) | Status: DC

## 2013-07-06 MED ORDER — LANCETS 30G MISC: 1.0000 [IU] | Freq: Two times a day (BID) | Status: DC

## 2013-07-06 NOTE — Telephone Encounter (Signed)
Notified daughter than she can pick up monitor here and that we will send script for supplies to [facility].  He tests twice daily.

## 2013-09-14 ENCOUNTER — Ambulatory Visit (INDEPENDENT_AMBULATORY_CARE_PROVIDER_SITE_OTHER): Payer: Medicare HMO | Admitting: Family Medicine

## 2013-09-14 ENCOUNTER — Encounter: Payer: Self-pay | Admitting: Family Medicine

## 2013-09-14 VITALS — BP 122/68 | HR 97 | Temp 97.3°F | Ht 73.0 in | Wt 191.4 lb

## 2013-09-14 DIAGNOSIS — E1165 Type 2 diabetes mellitus with hyperglycemia: Principal | ICD-10-CM

## 2013-09-14 DIAGNOSIS — I10 Essential (primary) hypertension: Secondary | ICD-10-CM

## 2013-09-14 DIAGNOSIS — IMO0001 Reserved for inherently not codable concepts without codable children: Secondary | ICD-10-CM

## 2013-09-14 DIAGNOSIS — E785 Hyperlipidemia, unspecified: Secondary | ICD-10-CM

## 2013-09-14 LAB — POCT CBC
Granulocyte percent: 43.2 %G (ref 37–80)
HCT, POC: 39.5 % — AB (ref 43.5–53.7)
Hemoglobin: 12.5 g/dL — AB (ref 14.1–18.1)
Lymph, poc: 2.3 (ref 0.6–3.4)
MCH, POC: 30.1 pg (ref 27–31.2)
MCHC: 31.7 g/dL — AB (ref 31.8–35.4)
MCV: 95 fL (ref 80–97)
MPV: 5.5 fL (ref 0–99.8)
POC Granulocyte: 2.4 (ref 2–6.9)
POC LYMPH PERCENT: 41.3 %L (ref 10–50)
Platelet Count, POC: 362 10*3/uL (ref 142–424)
RBC: 4.2 M/uL — AB (ref 4.69–6.13)
RDW, POC: 14.2 %
WBC: 5.5 10*3/uL (ref 4.6–10.2)

## 2013-09-14 LAB — POCT UA - MICROALBUMIN: Microalbumin Ur, POC: 20 mg/L

## 2013-09-14 LAB — POCT GLYCOSYLATED HEMOGLOBIN (HGB A1C): Hemoglobin A1C: 9.4

## 2013-09-14 NOTE — Progress Notes (Signed)
   Subjective:    Patient ID: Barry Welch, male    DOB: 08-16-1940, 73 y.o.   MRN: [id number]  HPI  This 73 y.o. male presents for evaluation of DM.  He his last hgbaic was 7.5% and he has adjusted his diet.  He has not had any problems with his vision or problems with neuropathy in feet or any foot problems.  He has met his diabetic initiative goals. He has no acute medical complaints.  Review of Systems    No chest pain, SOB, HA, dizziness, vision change, N/V, diarrhea, constipation, dysuria, urinary urgency or frequency, myalgias, arthralgias or rash.   Objective:   Physical Exam  Vital signs noted  Well developed well nourished male.  HEENT - Head atraumatic Normocephalic                Eyes - PERRLA, Conjuctiva - clear Sclera- Clear EOMI                Ears - EAC's Wnl TM's Wnl Gross Hearing WNL                Nose - Nares patent                 Throat - oropharanx wnl Respiratory - Lungs CTA bilateral Cardiac - RRR S1 and S2 without murmur GI - Abdomen soft Nontender and bowel sounds active x 4 Extremities - No edema. Neuro - Grossly intact.      Assessment & Plan:  Type II or unspecified type diabetes mellitus without mention of complication, uncontrolled - Plan: POCT CBC, POCT glycosylated hemoglobin (Hb A1C), CMP14+EGFR, Lipid panel, POCT UA - Microalbumin  Essential hypertension, benign - Plan: POCT CBC, POCT glycosylated hemoglobin (Hb A1C), CMP14+EGFR, Lipid panel, POCT UA - Microalbumin  Other and unspecified hyperlipidemia - Plan: POCT CBC, POCT glycosylated hemoglobin (Hb A1C), CMP14+EGFR, Lipid panel, POCT UA - Microalbumin  Follow up in 3 months  Lysbeth Penner FNP

## 2013-09-14 NOTE — Addendum Note (Signed)
Addended by: Selmer Dominion on: 09/14/2013 10:40 AM   Modules accepted: Orders

## 2013-09-15 ENCOUNTER — Other Ambulatory Visit: Payer: Self-pay | Admitting: *Deleted

## 2013-09-15 LAB — CMP14+EGFR
ALT: 15 IU/L (ref 0–44)
AST: 19 IU/L (ref 0–40)
Albumin/Globulin Ratio: 1.3 (ref 1.1–2.5)
Albumin: 3.9 g/dL (ref 3.5–4.8)
Alkaline Phosphatase: 41 IU/L (ref 39–117)
BUN/Creatinine Ratio: 11 (ref 10–22)
BUN: 27 mg/dL (ref 8–27)
CO2: 20 mmol/L (ref 18–29)
Calcium: 9.7 mg/dL (ref 8.6–10.2)
Chloride: 102 mmol/L (ref 97–108)
Creatinine, Ser: 2.5 mg/dL — ABNORMAL HIGH (ref 0.76–1.27)
GFR calc Af Amer: 28 mL/min/{1.73_m2} — ABNORMAL LOW (ref >59)
GFR calc non Af Amer: 24 mL/min/{1.73_m2} — ABNORMAL LOW (ref >59)
Globulin, Total: 3 g/dL (ref 1.5–4.5)
Glucose: 189 mg/dL — ABNORMAL HIGH (ref 65–99)
Potassium: 5.6 mmol/L — ABNORMAL HIGH (ref 3.5–5.2)
Sodium: 139 mmol/L (ref 134–144)
Total Bilirubin: 0.2 mg/dL (ref 0.0–1.2)
Total Protein: 6.9 g/dL (ref 6.0–8.5)

## 2013-09-15 LAB — MICROALBUMIN, URINE: Microalbumin, Urine: <3 ug/mL (ref 0.0–17.0)

## 2013-09-15 LAB — LIPID PANEL
Chol/HDL Ratio: 5.3 ratio units — ABNORMAL HIGH (ref 0.0–5.0)
Cholesterol, Total: 133 mg/dL (ref 100–199)
HDL: 25 mg/dL — ABNORMAL LOW (ref >39)
LDL Calculated: 76 mg/dL (ref 0–99)
Triglycerides: 158 mg/dL — ABNORMAL HIGH (ref 0–149)
VLDL Cholesterol Cal: 32 mg/dL (ref 5–40)

## 2013-09-15 MED ORDER — BENAZEPRIL HCL 10 MG PO TABS: ORAL_TABLET | ORAL | Status: DC

## 2013-10-17 ENCOUNTER — Other Ambulatory Visit: Payer: Self-pay

## 2013-10-17 MED ORDER — ATORVASTATIN CALCIUM 40 MG PO TABS: ORAL_TABLET | ORAL | Status: DC

## 2013-10-17 MED ORDER — FENOFIBRATE 145 MG PO TABS: ORAL_TABLET | ORAL | Status: DC

## 2013-11-18 ENCOUNTER — Encounter: Payer: Self-pay | Admitting: *Deleted

## 2013-12-15 ENCOUNTER — Ambulatory Visit: Payer: Medicare HMO | Admitting: Family Medicine

## 2013-12-21 ENCOUNTER — Ambulatory Visit (INDEPENDENT_AMBULATORY_CARE_PROVIDER_SITE_OTHER): Payer: Medicare HMO | Admitting: Family Medicine

## 2013-12-21 ENCOUNTER — Encounter: Payer: Self-pay | Admitting: Family Medicine

## 2013-12-21 VITALS — BP 109/68 | HR 87 | Temp 96.7°F | Ht 73.0 in | Wt 190.0 lb

## 2013-12-21 DIAGNOSIS — R5383 Other fatigue: Secondary | ICD-10-CM

## 2013-12-21 DIAGNOSIS — E785 Hyperlipidemia, unspecified: Secondary | ICD-10-CM

## 2013-12-21 DIAGNOSIS — E1165 Type 2 diabetes mellitus with hyperglycemia: Secondary | ICD-10-CM

## 2013-12-21 DIAGNOSIS — Z23 Encounter for immunization: Secondary | ICD-10-CM

## 2013-12-21 DIAGNOSIS — IMO0001 Reserved for inherently not codable concepts without codable children: Secondary | ICD-10-CM

## 2013-12-21 DIAGNOSIS — M79672 Pain in left foot: Secondary | ICD-10-CM

## 2013-12-21 LAB — POCT CBC
Granulocyte percent: 60.4 %G (ref 37–80)
HCT, POC: 40 % — AB (ref 43.5–53.7)
Hemoglobin: 12.9 g/dL — AB (ref 14.1–18.1)
Lymph, poc: 1.7 (ref 0.6–3.4)
MCH, POC: 30.5 pg (ref 27–31.2)
MCHC: 32.3 g/dL (ref 31.8–35.4)
MCV: 94.3 fL (ref 80–97)
MPV: 5.9 fL (ref 0–99.8)
POC Granulocyte: 3.6 (ref 2–6.9)
POC LYMPH PERCENT: 27.9 %L (ref 10–50)
Platelet Count, POC: 259 10*3/uL (ref 142–424)
RBC: 4.2 M/uL — AB (ref 4.69–6.13)
RDW, POC: 14.2 %
WBC: 6 10*3/uL (ref 4.6–10.2)

## 2013-12-21 LAB — GLUCOSE, POCT (MANUAL RESULT ENTRY): POC Glucose: 129 mg/dl — AB (ref 70–99)

## 2013-12-21 LAB — POCT GLYCOSYLATED HEMOGLOBIN (HGB A1C): Hemoglobin A1C: 10.9

## 2013-12-21 MED ORDER — DICLOFENAC SODIUM 1 % TD GEL: 2.0000 g | Freq: Four times a day (QID) | TRANSDERMAL | Status: DC

## 2013-12-21 NOTE — Progress Notes (Signed)
   Subjective:    Patient ID: Barry Welch, male    DOB: 12/19/1940, 73 y.o.   MRN: [id number]  HPI  This 73 y.o. male presents for evaluation of diabetes, HTN, and hyperlipidemia. He has stage 4 CKD. He is having left ankle discomfot  Review of Systems    No chest pain, SOB, HA, dizziness, vision change, N/V, diarrhea, constipation, dysuria, urinary urgency or frequency, myalgias, arthralgias or rash.  Objective:   Physical Exam Vital signs noted  Well developed well nourished male.  HEENT - Head atraumatic Normocephalic                Eyes - PERRLA, Conjuctiva - clear Sclera- Clear EOMI                Ears - EAC's Wnl TM's Wnl Gross Hearing WNL                Nose - Nares patent                 Throat - oropharanx wnl Respiratory - Lungs CTA bilateral Cardiac - RRR S1 and S2 without murmur GI - Abdomen soft Nontender and bowel sounds active x 4 Extremities - No edema. Neuro - Grossly intact.      Assessment & Plan:  Uncontrolled diabetes mellitus type 2 without complications - Plan: POCT glycosylated hemoglobin (Hb A1C), CMP14+EGFR, Lipid panel, POCT glucose (manual entry)  Hyperlipemia - Plan: Lipid panel  Other fatigue - Plan: POCT CBC  Heel pain, left - Plan: diclofenac sodium (VOLTAREN) 1 % GEL qid prn.  No oral NSAIDs due to CKD.  Encounter for immunization  Follow up in 3 months  Lysbeth Penner FNP

## 2013-12-22 LAB — CMP14+EGFR
ALT: 17 IU/L (ref 0–44)
AST: 22 IU/L (ref 0–40)
Albumin/Globulin Ratio: 1.3 (ref 1.1–2.5)
Albumin: 4.1 g/dL (ref 3.5–4.8)
Alkaline Phosphatase: 38 IU/L — ABNORMAL LOW (ref 39–117)
BUN/Creatinine Ratio: 10 (ref 10–22)
BUN: 20 mg/dL (ref 8–27)
CO2: 23 mmol/L (ref 18–29)
Calcium: 9.6 mg/dL (ref 8.6–10.2)
Chloride: 101 mmol/L (ref 97–108)
Creatinine, Ser: 2.03 mg/dL — ABNORMAL HIGH (ref 0.76–1.27)
GFR calc Af Amer: 36 mL/min/{1.73_m2} — ABNORMAL LOW (ref >59)
GFR calc non Af Amer: 31 mL/min/{1.73_m2} — ABNORMAL LOW (ref >59)
Globulin, Total: 3.1 g/dL (ref 1.5–4.5)
Glucose: 133 mg/dL — ABNORMAL HIGH (ref 65–99)
Potassium: 5.2 mmol/L (ref 3.5–5.2)
Sodium: 140 mmol/L (ref 134–144)
Total Bilirubin: 0.3 mg/dL (ref 0.0–1.2)
Total Protein: 7.2 g/dL (ref 6.0–8.5)

## 2013-12-22 LAB — LIPID PANEL
Chol/HDL Ratio: 3.9 ratio units (ref 0.0–5.0)
Cholesterol, Total: 128 mg/dL (ref 100–199)
HDL: 33 mg/dL — ABNORMAL LOW (ref >39)
LDL Calculated: 76 mg/dL (ref 0–99)
Triglycerides: 95 mg/dL (ref 0–149)
VLDL Cholesterol Cal: 19 mg/dL (ref 5–40)

## 2013-12-26 ENCOUNTER — Telehealth: Payer: Self-pay | Admitting: *Deleted

## 2013-12-26 ENCOUNTER — Other Ambulatory Visit: Payer: Self-pay | Admitting: Family Medicine

## 2013-12-26 NOTE — Telephone Encounter (Signed)
Message copied by Shelbie Ammons on Mon Dec 26, 2013  2:42 PM ------      Message from: Lysbeth Penner      Created: Mon Dec 26, 2013 11:02 AM       Labs stable, CKD stable ------

## 2013-12-26 NOTE — Telephone Encounter (Signed)
Aware. 

## 2014-01-24 ENCOUNTER — Encounter: Payer: Self-pay | Admitting: *Deleted

## 2014-02-15 ENCOUNTER — Ambulatory Visit (INDEPENDENT_AMBULATORY_CARE_PROVIDER_SITE_OTHER): Payer: Commercial Managed Care - HMO | Admitting: Family Medicine

## 2014-02-15 ENCOUNTER — Encounter: Payer: Self-pay | Admitting: Family Medicine

## 2014-02-15 VITALS — BP 117/68 | HR 97 | Temp 96.5°F | Wt 190.0 lb

## 2014-02-15 DIAGNOSIS — G510 Bell's palsy: Secondary | ICD-10-CM

## 2014-02-15 MED ORDER — VALACYCLOVIR HCL 500 MG PO TABS: 500.0000 mg | ORAL_TABLET | Freq: Two times a day (BID) | ORAL | Status: DC

## 2014-02-15 MED ORDER — PREDNISONE 10 MG PO TABS: ORAL_TABLET | ORAL | Status: DC

## 2014-02-15 NOTE — Progress Notes (Signed)
   Subjective:    Patient ID: Barry Welch, male    DOB: October 25, 1940, 73 y.o.   MRN: [id number]  HPI Patient is here with c/o left facial numbness and drooping for 2 days.  He cannot close his left eye. He c/o left eye irritation.  He is worried about having a CVA.   Review of Systems  C/o left face drooping No chest pain, SOB, HA, dizziness, vision change, N/V, diarrhea, constipation, dysuria, urinary urgency or frequency, myalgias, arthralgias or rash.     Objective:   Physical Exam   Neuro - Unable to move forehead and close left eye.  Left face flaccid.     Assessment & Plan:  Bell's palsy - Plan: predniSONE (DELTASONE) 10 MG tablet, valACYclovir (VALTREX) 500 MG tablet  Pred taper - 30mg  bid x 4 days then 50mg  x 1 40mg  x 1 30mg  x 1 20mg  x 1 10mg  x 1 then stop Valtrex 500mg  po bid x 14 days  Patch Left eye and explained to family to get Nu Tears OTC and put in his left eye and keep covered until he can get strength in left face.  Lysbeth Penner FNP

## 2014-03-13 ENCOUNTER — Other Ambulatory Visit: Payer: Self-pay | Admitting: Family Medicine

## 2014-03-22 DIAGNOSIS — E1122 Type 2 diabetes mellitus with diabetic chronic kidney disease: Secondary | ICD-10-CM | POA: Diagnosis not present

## 2014-03-22 DIAGNOSIS — E782 Mixed hyperlipidemia: Secondary | ICD-10-CM | POA: Diagnosis not present

## 2014-03-22 DIAGNOSIS — I10 Essential (primary) hypertension: Secondary | ICD-10-CM | POA: Diagnosis not present

## 2014-04-03 ENCOUNTER — Ambulatory Visit: Payer: Medicare HMO | Admitting: Family Medicine

## 2014-04-03 ENCOUNTER — Ambulatory Visit (INDEPENDENT_AMBULATORY_CARE_PROVIDER_SITE_OTHER): Payer: Commercial Managed Care - HMO | Admitting: Family

## 2014-04-03 ENCOUNTER — Encounter: Payer: Self-pay | Admitting: Family

## 2014-04-03 VITALS — BP 120/72 | HR 97 | Temp 97.0°F | Ht 73.0 in | Wt 190.0 lb

## 2014-04-03 DIAGNOSIS — E785 Hyperlipidemia, unspecified: Secondary | ICD-10-CM | POA: Diagnosis not present

## 2014-04-03 DIAGNOSIS — N184 Chronic kidney disease, stage 4 (severe): Secondary | ICD-10-CM | POA: Diagnosis not present

## 2014-04-03 DIAGNOSIS — E559 Vitamin D deficiency, unspecified: Secondary | ICD-10-CM

## 2014-04-03 DIAGNOSIS — I10 Essential (primary) hypertension: Secondary | ICD-10-CM

## 2014-04-03 DIAGNOSIS — E119 Type 2 diabetes mellitus without complications: Secondary | ICD-10-CM | POA: Diagnosis not present

## 2014-04-03 LAB — GLUCOSE, POCT (MANUAL RESULT ENTRY): POC GLUCOSE: 235 mg/dL — AB (ref 70–99)

## 2014-04-03 LAB — POCT GLYCOSYLATED HEMOGLOBIN (HGB A1C): Hemoglobin A1C: 10.5

## 2014-04-03 NOTE — Addendum Note (Signed)
Addended by: Earlene Plater on: 04/03/2014 12:27 PM   Modules accepted: Orders

## 2014-04-03 NOTE — Progress Notes (Signed)
Subjective:    Patient ID: Barry Welch, male    DOB: [birth year], 74 y.o.   MRN: [id number]  Diabetes He presents for his follow-up diabetic visit. He has type 2 diabetes mellitus. His disease course has been stable. Pertinent negatives for hypoglycemia include no confusion, dizziness or mood changes. Pertinent negatives for diabetes include no blurred vision, no foot paresthesias, no foot ulcerations, no polyuria, no visual change and no weakness. Pertinent negatives for hypoglycemia complications include no blackouts and no hospitalization. Symptoms are stable. Diabetic complications include nephropathy. Pertinent negatives for diabetic complications include no CVA, heart disease, peripheral neuropathy or PVD. Risk factors for coronary artery disease include diabetes mellitus, dyslipidemia, hypertension and male sex. Current diabetic treatment includes insulin injections. He is compliant with treatment all of the time. He is following a diabetic diet. His breakfast blood glucose range is generally 90-110 mg/dl. An ACE inhibitor/angiotensin II receptor blocker is being taken. Eye exam is not current (P thas one schedule 02/03).  Hyperlipidemia This is a chronic problem. The current episode started more than 1 year ago. The problem is controlled. Recent lipid tests were reviewed and are normal. Exacerbating diseases include diabetes. He has no history of hypothyroidism. Factors aggravating his hyperlipidemia include fatty foods. Pertinent negatives include no leg pain, myalgias or shortness of breath. Current antihyperlipidemic treatment includes statins and fibric acid derivatives. The current treatment provides moderate improvement of lipids. Risk factors for coronary artery disease include diabetes mellitus, dyslipidemia, hypertension and male sex.  Hypertension This is a chronic problem. The current episode started more than 1 year ago. The problem has been resolved since onset. The problem is  controlled. Pertinent negatives include no anxiety, blurred vision, palpitations, peripheral edema or shortness of breath. Risk factors for coronary artery disease include diabetes mellitus, dyslipidemia, family history and male gender. Past treatments include ACE inhibitors. The current treatment provides moderate improvement. Hypertensive end-organ damage includes kidney disease. There is no history of CVA, heart failure, PVD or a thyroid problem. There is no history of sleep apnea.      Review of Systems  Constitutional: Negative.   HENT: Negative.   Eyes: Negative for blurred vision.  Respiratory: Negative.  Negative for shortness of breath.   Cardiovascular: Negative.  Negative for palpitations.  Gastrointestinal: Negative.   Endocrine: Negative.  Negative for polyuria.  Genitourinary: Negative.   Musculoskeletal: Negative.  Negative for myalgias.  Neurological: Negative.  Negative for dizziness and weakness.  Hematological: Negative.   Psychiatric/Behavioral: Negative.  Negative for confusion.  All other systems reviewed and are negative.      Objective:   Physical Exam  Constitutional: He is oriented to person, place, and time. He appears well-developed and well-nourished. No distress.  HENT:  Head: Normocephalic.  Right Ear: External ear normal.  Left Ear: External ear normal.  Mouth/Throat: Oropharynx is clear and moist.  Eyes: Pupils are equal, round, and reactive to light. Right eye exhibits no discharge. Left eye exhibits no discharge.  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 Neck supple. No thyromegaly present.  Cardiovascular: Normal rate, regular rhythm, normal heart sounds and intact distal pulses.   No murmur heard. Pulmonary/Chest: Effort normal and breath sounds normal. No respiratory distress. He has no wheezes.  Abdominal: Soft. Bowel sounds are normal. He exhibits no distension. There is no tenderness.  Musculoskeletal:  Normal range of motion. He exhibits no edema or tenderness.  Neurological: He is alert and oriented to person,  place, and time. He has normal reflexes. No cranial nerve deficit.  Skin: Skin is warm and dry. No rash noted. No erythema.  Psychiatric: He has a normal mood and affect. His behavior is normal. Judgment and thought content normal.  Vitals reviewed.     BP 120/72 mmHg  Pulse 97  Temp(Src) 97 F (36.1 C) (Oral)  Ht _0  (1.854 m)  Wt 190 lb (86.183 kg)  BMI 25.07 kg/m2     Assessment & Plan:  1. Essential hypertension - CMP14+EGFR  2. Type 2 diabetes mellitus without complication - POCT glycosylated hemoglobin (Hb A1C) - CMP14+EGFR  3. Chronic kidney disease (CKD), stage IV (severe) - CMP14+EGFR  4. Vitamin D deficiency - CMP14+EGFR - Vit D  25 hydroxy (rtn osteoporosis monitoring)  5. Hyperlipidemia - CMP14+EGFR - Lipid panel   Continue all meds Labs pending Health Maintenance reviewed Diet and exercise encouraged RTO 3 months  Evelina Dun, FNP

## 2014-04-03 NOTE — Patient Instructions (Signed)

## 2014-04-04 DIAGNOSIS — E1129 Type 2 diabetes mellitus with other diabetic kidney complication: Secondary | ICD-10-CM | POA: Diagnosis not present

## 2014-04-04 DIAGNOSIS — I129 Hypertensive chronic kidney disease with stage 1 through stage 4 chronic kidney disease, or unspecified chronic kidney disease: Secondary | ICD-10-CM | POA: Diagnosis not present

## 2014-04-04 DIAGNOSIS — E785 Hyperlipidemia, unspecified: Secondary | ICD-10-CM | POA: Diagnosis not present

## 2014-04-04 DIAGNOSIS — N183 Chronic kidney disease, stage 3 (moderate): Secondary | ICD-10-CM | POA: Diagnosis not present

## 2014-04-04 LAB — CMP14+EGFR
ALT: 16 IU/L (ref 0–44)
AST: 17 IU/L (ref 0–40)
Albumin/Globulin Ratio: 1.3 (ref 1.1–2.5)
Albumin: 3.9 g/dL (ref 3.5–4.8)
Alkaline Phosphatase: 45 IU/L (ref 39–117)
BILIRUBIN TOTAL: 0.4 mg/dL (ref 0.0–1.2)
BUN / CREAT RATIO: 10 (ref 10–22)
BUN: 21 mg/dL (ref 8–27)
CO2: 23 mmol/L (ref 18–29)
Calcium: 9.4 mg/dL (ref 8.6–10.2)
Chloride: 100 mmol/L (ref 97–108)
Creatinine, Ser: 2.03 mg/dL — ABNORMAL HIGH (ref 0.76–1.27)
GFR calc non Af Amer: 31 mL/min/{1.73_m2} — ABNORMAL LOW (ref >59)
GFR, EST AFRICAN AMERICAN: 36 mL/min/{1.73_m2} — AB (ref >59)
Globulin, Total: 2.9 g/dL (ref 1.5–4.5)
Glucose: 228 mg/dL — ABNORMAL HIGH (ref 65–99)
Potassium: 4.9 mmol/L (ref 3.5–5.2)
Sodium: 138 mmol/L (ref 134–144)
Total Protein: 6.8 g/dL (ref 6.0–8.5)

## 2014-04-04 LAB — LIPID PANEL
CHOLESTEROL TOTAL: 133 mg/dL (ref 100–199)
Chol/HDL Ratio: 4.6 ratio units (ref 0.0–5.0)
HDL: 29 mg/dL — ABNORMAL LOW (ref >39)
LDL Calculated: 77 mg/dL (ref 0–99)
Triglycerides: 135 mg/dL (ref 0–149)
VLDL CHOLESTEROL CAL: 27 mg/dL (ref 5–40)

## 2014-04-04 LAB — VITAMIN D 25 HYDROXY (VIT D DEFICIENCY, FRACTURES): Vit D, 25-Hydroxy: 25.8 ng/mL — ABNORMAL LOW (ref 30.0–100.0)

## 2014-04-05 ENCOUNTER — Telehealth: Payer: Self-pay | Admitting: Family

## 2014-04-05 NOTE — Telephone Encounter (Signed)
-----   Message from Sharion Balloon, [place] sent at 04/04/2014  9:17 AM EST ----- HgbA1C extremely elevated-Pt needs to be on low carb diet and check glucose regularly-Pt needs to make an appt with Tammy asap! Kidney and liver function stable- Creatinine at baseline- NO NSAID's Cholesterol levels WNL Vit D levels low- Will hold off on Vit D r/t to kidney function

## 2014-04-12 DIAGNOSIS — H4011X3 Primary open-angle glaucoma, severe stage: Secondary | ICD-10-CM | POA: Diagnosis not present

## 2014-04-12 DIAGNOSIS — H04123 Dry eye syndrome of bilateral lacrimal glands: Secondary | ICD-10-CM | POA: Diagnosis not present

## 2014-04-12 DIAGNOSIS — H4011X2 Primary open-angle glaucoma, moderate stage: Secondary | ICD-10-CM | POA: Diagnosis not present

## 2014-04-12 DIAGNOSIS — H02204 Unspecified lagophthalmos left upper eyelid: Secondary | ICD-10-CM | POA: Diagnosis not present

## 2014-04-14 NOTE — Telephone Encounter (Signed)
Patient aware.

## 2014-05-10 ENCOUNTER — Ambulatory Visit: Payer: Commercial Managed Care - HMO | Admitting: Family Medicine

## 2014-05-10 DIAGNOSIS — L089 Local infection of the skin and subcutaneous tissue, unspecified: Secondary | ICD-10-CM | POA: Diagnosis not present

## 2014-05-10 DIAGNOSIS — M5440 Lumbago with sciatica, unspecified side: Secondary | ICD-10-CM | POA: Diagnosis not present

## 2014-05-17 ENCOUNTER — Other Ambulatory Visit: Payer: Self-pay | Admitting: *Deleted

## 2014-05-17 MED ORDER — ACCU-CHEK AVIVA PLUS W/DEVICE KIT: PACK | Status: DC

## 2014-05-17 MED ORDER — ACCU-CHEK SOFT TOUCH LANCETS MISC
Status: DC
Start: 2014-05-17 — End: 2015-05-10

## 2014-05-17 MED ORDER — GLUCOSE BLOOD VI STRP: ORAL_STRIP | Status: DC

## 2014-05-22 DIAGNOSIS — E782 Mixed hyperlipidemia: Secondary | ICD-10-CM | POA: Diagnosis not present

## 2014-05-22 DIAGNOSIS — E1122 Type 2 diabetes mellitus with diabetic chronic kidney disease: Secondary | ICD-10-CM | POA: Diagnosis not present

## 2014-05-22 DIAGNOSIS — I10 Essential (primary) hypertension: Secondary | ICD-10-CM | POA: Diagnosis not present

## 2014-05-22 DIAGNOSIS — Z713 Dietary counseling and surveillance: Secondary | ICD-10-CM | POA: Diagnosis not present

## 2014-06-01 DIAGNOSIS — E1122 Type 2 diabetes mellitus with diabetic chronic kidney disease: Secondary | ICD-10-CM | POA: Diagnosis not present

## 2014-06-01 DIAGNOSIS — I10 Essential (primary) hypertension: Secondary | ICD-10-CM | POA: Diagnosis not present

## 2014-06-01 DIAGNOSIS — E785 Hyperlipidemia, unspecified: Secondary | ICD-10-CM | POA: Diagnosis not present

## 2014-07-04 ENCOUNTER — Encounter: Payer: Self-pay | Admitting: Family

## 2014-07-04 ENCOUNTER — Ambulatory Visit (INDEPENDENT_AMBULATORY_CARE_PROVIDER_SITE_OTHER): Payer: Commercial Managed Care - HMO | Admitting: Family

## 2014-07-04 VITALS — BP 126/80 | HR 87 | Temp 97.3°F | Ht 73.0 in | Wt 192.2 lb

## 2014-07-04 DIAGNOSIS — E785 Hyperlipidemia, unspecified: Secondary | ICD-10-CM | POA: Diagnosis not present

## 2014-07-04 DIAGNOSIS — I10 Essential (primary) hypertension: Secondary | ICD-10-CM

## 2014-07-04 DIAGNOSIS — E559 Vitamin D deficiency, unspecified: Secondary | ICD-10-CM | POA: Diagnosis not present

## 2014-07-04 DIAGNOSIS — E119 Type 2 diabetes mellitus without complications: Secondary | ICD-10-CM | POA: Diagnosis not present

## 2014-07-04 DIAGNOSIS — N184 Chronic kidney disease, stage 4 (severe): Secondary | ICD-10-CM

## 2014-07-04 DIAGNOSIS — Z23 Encounter for immunization: Secondary | ICD-10-CM

## 2014-07-04 DIAGNOSIS — Z299 Encounter for prophylactic measures, unspecified: Secondary | ICD-10-CM

## 2014-07-04 LAB — POCT GLYCOSYLATED HEMOGLOBIN (HGB A1C): Hemoglobin A1C: 9.9

## 2014-07-04 NOTE — Progress Notes (Signed)
Subjective:    Patient ID: Barry Welch, male    DOB: December 29, 1940, 74 y.o.   MRN: [id number]  Diabetes He presents for his follow-up diabetic visit. He has type 2 diabetes mellitus. His disease course has been stable. Pertinent negatives for hypoglycemia include no confusion, dizziness or mood changes. Pertinent negatives for diabetes include no foot paresthesias, no foot ulcerations, no polyuria, no visual change and no weakness. Pertinent negatives for hypoglycemia complications include no blackouts and no hospitalization. Symptoms are stable. Diabetic complications include nephropathy. Pertinent negatives for diabetic complications include no CVA, heart disease, peripheral neuropathy or PVD. Risk factors for coronary artery disease include diabetes mellitus, dyslipidemia, hypertension and male sex. Current diabetic treatment includes insulin injections. He is compliant with treatment all of the time. He is following a diabetic diet. His breakfast blood glucose range is generally 90-110 mg/dl. An ACE inhibitor/angiotensin II receptor blocker is being taken. Eye exam is current.  Hypertension This is a chronic problem. The current episode started more than 1 year ago. The problem has been resolved since onset. The problem is controlled. Pertinent negatives include no anxiety, palpitations, peripheral edema or shortness of breath. Risk factors for coronary artery disease include diabetes mellitus, dyslipidemia, family history and male gender. Past treatments include ACE inhibitors. The current treatment provides moderate improvement. Hypertensive end-organ damage includes kidney disease. There is no history of CVA, heart failure, PVD or a thyroid problem. There is no history of sleep apnea.  Hyperlipidemia This is a chronic problem. The current episode started more than 1 year ago. The problem is controlled. Recent lipid tests were reviewed and are normal. Exacerbating diseases include diabetes. He has  no history of hypothyroidism. Factors aggravating his hyperlipidemia include fatty foods. Pertinent negatives include no leg pain, myalgias or shortness of breath. Current antihyperlipidemic treatment includes statins and fibric acid derivatives. The current treatment provides moderate improvement of lipids. Risk factors for coronary artery disease include diabetes mellitus, dyslipidemia, hypertension and male sex.      Review of Systems  Constitutional: Negative.   HENT: Negative.   Respiratory: Negative.  Negative for shortness of breath.   Cardiovascular: Negative.  Negative for palpitations.  Gastrointestinal: Negative.   Endocrine: Negative.  Negative for polyuria.  Genitourinary: Negative.   Musculoskeletal: Negative.  Negative for myalgias.  Neurological: Negative.  Negative for dizziness and weakness.  Hematological: Negative.   Psychiatric/Behavioral: Negative.  Negative for confusion.  All other systems reviewed and are negative.      Objective:   Physical Exam  Constitutional: He is oriented to person, place, and time. He appears well-developed and well-nourished. No distress.  HENT:  Head: Normocephalic.  Right Ear: External ear normal.  Left Ear: External ear normal.  Nose: Nose normal.  Mouth/Throat: Oropharynx is clear and moist.  Neck: Normal range of motion. Neck supple. No thyromegaly present.  Cardiovascular: Normal rate, regular rhythm, normal heart sounds and intact distal pulses.   No murmur heard. Pulmonary/Chest: Effort normal and breath sounds normal. No respiratory distress. He has no wheezes.  Abdominal: Soft. Bowel sounds are normal. He exhibits no distension. There is no tenderness.  Musculoskeletal: Normal range of motion. He exhibits no edema or tenderness.  Neurological: He is alert and oriented to person, place, and time. He has normal reflexes. No cranial nerve deficit.  Skin: Skin is warm and dry. No rash noted. No erythema.  Psychiatric: He  has a normal mood and affect. His behavior is normal. Judgment and thought content normal.  Vitals reviewed.    BP 126/80 mmHg  Pulse 87  Temp(Src) 97.3 F (36.3 C) (Oral)  Ht 6' 1"  (1.854 m)  Wt 192 lb 3.2 oz (87.181 kg)  BMI 25.36 kg/m2      Assessment & Plan:  1. Essential hypertension - CMP14+EGFR  2. Type 2 diabetes mellitus without complication - POCT glycosylated hemoglobin (Hb A1C) - CMP14+EGFR  3. Chronic kidney disease (CKD), stage IV (severe) - CMP14+EGFR  4. Hyperlipidemia - CMP14+EGFR  5. Vitamin D deficiency - CMP14+EGFR - Vit D  25 hydroxy (rtn osteoporosis monitoring)   Continue all meds Labs pending Health Maintenance reviewed- Prevnar given today Diet and exercise encouraged RTO 3 months   Evelina Dun, FNP

## 2014-07-04 NOTE — Patient Instructions (Signed)

## 2014-07-05 ENCOUNTER — Other Ambulatory Visit: Payer: Self-pay | Admitting: Family

## 2014-07-05 ENCOUNTER — Telehealth: Payer: Self-pay | Admitting: *Deleted

## 2014-07-05 ENCOUNTER — Telehealth: Payer: Self-pay | Admitting: Family

## 2014-07-05 LAB — CMP14+EGFR
ALK PHOS: 42 IU/L (ref 39–117)
ALT: 20 IU/L (ref 0–44)
AST: 23 IU/L (ref 0–40)
Albumin/Globulin Ratio: 1.4 (ref 1.1–2.5)
Albumin: 4.2 g/dL (ref 3.5–4.8)
BILIRUBIN TOTAL: 0.4 mg/dL (ref 0.0–1.2)
BUN / CREAT RATIO: 10 (ref 10–22)
BUN: 21 mg/dL (ref 8–27)
CO2: 23 mmol/L (ref 18–29)
Calcium: 9.5 mg/dL (ref 8.6–10.2)
Chloride: 103 mmol/L (ref 97–108)
Creatinine, Ser: 2.05 mg/dL — ABNORMAL HIGH (ref 0.76–1.27)
GFR calc non Af Amer: 31 mL/min/{1.73_m2} — ABNORMAL LOW (ref >59)
GFR, EST AFRICAN AMERICAN: 36 mL/min/{1.73_m2} — AB (ref >59)
GLOBULIN, TOTAL: 3 g/dL (ref 1.5–4.5)
Glucose: 95 mg/dL (ref 65–99)
Potassium: 4.5 mmol/L (ref 3.5–5.2)
Sodium: 140 mmol/L (ref 134–144)
Total Protein: 7.2 g/dL (ref 6.0–8.5)

## 2014-07-05 LAB — VITAMIN D 25 HYDROXY (VIT D DEFICIENCY, FRACTURES): Vit D, 25-Hydroxy: 23.9 ng/mL — ABNORMAL LOW (ref 30.0–100.0)

## 2014-07-05 NOTE — Telephone Encounter (Signed)
-----   Message from Sharion Balloon, [place] sent at 07/05/2014 10:24 AM EDT ----- HgbA1C very elevated! Pt needs to be on low carb diet- Pt needs appt with Tammy, pt needs to increase insulin to 25 units TID. Kidney and liver function stable- Creatinine elevated- Pt needs to avoid NSAID's- Has pt every seen an nephrologists?  Vit D levels low- Will not prescribe

## 2014-07-06 NOTE — Telephone Encounter (Signed)
This was handled in result notes.

## 2014-07-07 ENCOUNTER — Telehealth: Payer: Self-pay | Admitting: *Deleted

## 2014-07-07 NOTE — Telephone Encounter (Signed)
Daughter will speak to him and call back to schedule with our pharmacist.

## 2014-07-07 NOTE — Telephone Encounter (Signed)
-----   Message from Sharion Balloon, [place] sent at 07/05/2014  4:07 PM EDT ----- Madaline Brilliant! Pt needs to follow up with Tammy

## 2014-07-12 ENCOUNTER — Other Ambulatory Visit: Payer: Self-pay | Admitting: Family Medicine

## 2014-08-02 DIAGNOSIS — H16142 Punctate keratitis, left eye: Secondary | ICD-10-CM | POA: Diagnosis not present

## 2014-08-02 DIAGNOSIS — H02204 Unspecified lagophthalmos left upper eyelid: Secondary | ICD-10-CM | POA: Diagnosis not present

## 2014-08-02 DIAGNOSIS — H4011X3 Primary open-angle glaucoma, severe stage: Secondary | ICD-10-CM | POA: Diagnosis not present

## 2014-08-02 DIAGNOSIS — H04123 Dry eye syndrome of bilateral lacrimal glands: Secondary | ICD-10-CM | POA: Diagnosis not present

## 2014-08-28 DIAGNOSIS — Z713 Dietary counseling and surveillance: Secondary | ICD-10-CM | POA: Diagnosis not present

## 2014-08-28 DIAGNOSIS — I10 Essential (primary) hypertension: Secondary | ICD-10-CM | POA: Diagnosis not present

## 2014-08-28 DIAGNOSIS — E785 Hyperlipidemia, unspecified: Secondary | ICD-10-CM | POA: Diagnosis not present

## 2014-08-28 DIAGNOSIS — E1122 Type 2 diabetes mellitus with diabetic chronic kidney disease: Secondary | ICD-10-CM | POA: Diagnosis not present

## 2014-08-28 LAB — HEMOGLOBIN A1C
HEMOGLOBIN A1C: 10.5
HEMOGLOBIN A1C: 10.5 % — AB (ref 4.0–6.0)

## 2014-09-04 DIAGNOSIS — E785 Hyperlipidemia, unspecified: Secondary | ICD-10-CM | POA: Diagnosis not present

## 2014-09-04 DIAGNOSIS — E1122 Type 2 diabetes mellitus with diabetic chronic kidney disease: Secondary | ICD-10-CM | POA: Diagnosis not present

## 2014-09-04 DIAGNOSIS — I10 Essential (primary) hypertension: Secondary | ICD-10-CM | POA: Diagnosis not present

## 2014-09-23 DIAGNOSIS — L282 Other prurigo: Secondary | ICD-10-CM | POA: Diagnosis not present

## 2014-09-23 DIAGNOSIS — M179 Osteoarthritis of knee, unspecified: Secondary | ICD-10-CM | POA: Diagnosis not present

## 2014-10-06 ENCOUNTER — Ambulatory Visit (INDEPENDENT_AMBULATORY_CARE_PROVIDER_SITE_OTHER): Payer: Commercial Managed Care - HMO | Admitting: Family

## 2014-10-06 ENCOUNTER — Encounter: Payer: Self-pay | Admitting: Family

## 2014-10-06 VITALS — BP 137/80 | HR 73 | Temp 96.9°F | Ht 73.0 in | Wt 195.0 lb

## 2014-10-06 DIAGNOSIS — M179 Osteoarthritis of knee, unspecified: Secondary | ICD-10-CM | POA: Diagnosis not present

## 2014-10-06 DIAGNOSIS — M1990 Unspecified osteoarthritis, unspecified site: Secondary | ICD-10-CM | POA: Insufficient documentation

## 2014-10-06 DIAGNOSIS — E785 Hyperlipidemia, unspecified: Secondary | ICD-10-CM

## 2014-10-06 DIAGNOSIS — E119 Type 2 diabetes mellitus without complications: Secondary | ICD-10-CM

## 2014-10-06 DIAGNOSIS — E559 Vitamin D deficiency, unspecified: Secondary | ICD-10-CM | POA: Diagnosis not present

## 2014-10-06 DIAGNOSIS — I10 Essential (primary) hypertension: Secondary | ICD-10-CM

## 2014-10-06 DIAGNOSIS — N184 Chronic kidney disease, stage 4 (severe): Secondary | ICD-10-CM

## 2014-10-06 DIAGNOSIS — M1711 Unilateral primary osteoarthritis, right knee: Secondary | ICD-10-CM

## 2014-10-06 LAB — POCT GLYCOSYLATED HEMOGLOBIN (HGB A1C): Hemoglobin A1C: 9.5

## 2014-10-06 LAB — POCT UA - MICROALBUMIN: Microalbumin Ur, POC: NEGATIVE mg/L

## 2014-10-06 MED ORDER — DICLOFENAC SODIUM 50 MG PO TBEC: 50.0000 mg | DELAYED_RELEASE_TABLET | Freq: Two times a day (BID) | ORAL | Status: DC

## 2014-10-06 NOTE — Progress Notes (Signed)
Subjective:    Patient ID: Barry Welch, male    DOB: Feb 17, 1941, 74 y.o.   MRN: [id number]  Pt presents presents to office today for chronic follow up. Diabetes He presents for his follow-up diabetic visit. He has type 2 diabetes mellitus. His disease course has been stable. Pertinent negatives for hypoglycemia include no confusion, dizziness or mood changes. Pertinent negatives for diabetes include no foot paresthesias, no foot ulcerations, no polyuria, no visual change and no weakness. Pertinent negatives for hypoglycemia complications include no blackouts and no hospitalization. Symptoms are stable. Diabetic complications include nephropathy. Pertinent negatives for diabetic complications include no CVA, heart disease, peripheral neuropathy or PVD. Risk factors for coronary artery disease include diabetes mellitus, dyslipidemia, hypertension and male sex. Current diabetic treatment includes insulin injections. He is compliant with treatment all of the time. He is following a diabetic diet. His breakfast blood glucose range is generally 90-110 mg/dl. An ACE inhibitor/angiotensin II receptor blocker is being taken. Eye exam is current.  Hypertension This is a chronic problem. The current episode started more than 1 year ago. The problem has been resolved since onset. The problem is controlled. Pertinent negatives include no anxiety, palpitations, peripheral edema or shortness of breath. Risk factors for coronary artery disease include diabetes mellitus, dyslipidemia, family history and male gender. Past treatments include ACE inhibitors. The current treatment provides moderate improvement. Hypertensive end-organ damage includes kidney disease. There is no history of CVA, heart failure, PVD or a thyroid problem. There is no history of sleep apnea.  Hyperlipidemia This is a chronic problem. The current episode started more than 1 year ago. The problem is controlled. Recent lipid tests were reviewed  and are normal. Exacerbating diseases include diabetes. He has no history of hypothyroidism. Factors aggravating his hyperlipidemia include fatty foods. Pertinent negatives include no leg pain, myalgias or shortness of breath. Current antihyperlipidemic treatment includes statins and fibric acid derivatives. The current treatment provides moderate improvement of lipids. Risk factors for coronary artery disease include diabetes mellitus, dyslipidemia, hypertension and male sex.      Review of Systems  Constitutional: Negative.   HENT: Negative.   Respiratory: Negative.  Negative for shortness of breath.   Cardiovascular: Negative.  Negative for palpitations.  Gastrointestinal: Negative.   Endocrine: Negative.  Negative for polyuria.  Genitourinary: Negative.   Musculoskeletal: Negative.  Negative for myalgias.  Neurological: Negative.  Negative for dizziness and weakness.  Hematological: Negative.   Psychiatric/Behavioral: Negative.  Negative for confusion.  All other systems reviewed and are negative.      Objective:   Physical Exam  Constitutional: He is oriented to person, place, and time. He appears well-developed and well-nourished. No distress.  HENT:  Head: Normocephalic.  Right Ear: External ear normal.  Left Ear: External ear normal.  Nose: Nose normal.  Mouth/Throat: Oropharynx is clear and moist.  Eyes: Pupils are equal, round, and reactive to light. Right eye exhibits no discharge. Left eye exhibits no discharge.  Neck: Normal range of motion. Neck supple. No thyromegaly present.  Cardiovascular: Normal rate, regular rhythm, normal heart sounds and intact distal pulses.   No murmur heard. Pulmonary/Chest: Effort normal and breath sounds normal. No respiratory distress. He has no wheezes.  Abdominal: Soft. Bowel sounds are normal. He exhibits no distension. There is no tenderness.  Musculoskeletal: Normal range of motion. He exhibits no edema or tenderness.    Neurological: He is alert and oriented to person, place, and time. He has normal reflexes. No cranial nerve deficit.  Skin: Skin is warm and dry. No rash noted. No erythema.  Psychiatric: He has a normal mood and affect. His behavior is normal. Judgment and thought content normal.  Vitals reviewed.     BP 137/80 mmHg  Pulse 73  Temp(Src) 96.9 F (36.1 C) (Oral)  Ht 6' 1"  (1.854 m)  Wt 195 lb (88.451 kg)  BMI 25.73 kg/m2     Assessment & Plan:  1. Essential hypertension - CMP14+EGFR  2. Type 2 diabetes mellitus without complication - POCT glycosylated hemoglobin (Hb A1C) - CMP14+EGFR - POCT UA - Microalbumin  3. Chronic kidney disease (CKD), stage IV (severe) - CMP14+EGFR  4. Hyperlipidemia - CMP14+EGFR - Lipid panel  5. Vitamin D deficiency - CMP14+EGFR - Vit D  25 hydroxy (rtn osteoporosis monitoring)  6. Osteoarthritis of right knee, unspecified osteoarthritis type - diclofenac (VOLTAREN) 50 MG EC tablet; Take 1 tablet (50 mg total) by mouth 2 (two) times daily.  Dispense: 180 tablet; Refill: 1   Continue all meds Labs pending Health Maintenance reviewed Diet and exercise encouraged RTO 3 months  Evelina Dun, FNP

## 2014-10-06 NOTE — Addendum Note (Signed)
Addended by: Earlene Plater on: 10/06/2014 09:48 AM   Modules accepted: Miquel Dunn

## 2014-10-06 NOTE — Patient Instructions (Signed)

## 2014-10-07 LAB — LIPID PANEL
CHOL/HDL RATIO: 4.3 ratio (ref 0.0–5.0)
Cholesterol, Total: 129 mg/dL (ref 100–199)
HDL: 30 mg/dL — ABNORMAL LOW (ref >39)
LDL Calculated: 77 mg/dL (ref 0–99)
TRIGLYCERIDES: 111 mg/dL (ref 0–149)
VLDL Cholesterol Cal: 22 mg/dL (ref 5–40)

## 2014-10-07 LAB — CMP14+EGFR
A/G RATIO: 1.5 (ref 1.1–2.5)
ALT: 19 IU/L (ref 0–44)
AST: 20 IU/L (ref 0–40)
Albumin: 4.1 g/dL (ref 3.5–4.8)
Alkaline Phosphatase: 39 IU/L (ref 39–117)
BUN/Creatinine Ratio: 10 (ref 10–22)
BUN: 22 mg/dL (ref 8–27)
Bilirubin Total: 0.4 mg/dL (ref 0.0–1.2)
CHLORIDE: 100 mmol/L (ref 97–108)
CO2: 20 mmol/L (ref 18–29)
Calcium: 9.4 mg/dL (ref 8.6–10.2)
Creatinine, Ser: 2.17 mg/dL — ABNORMAL HIGH (ref 0.76–1.27)
GFR calc Af Amer: 33 mL/min/{1.73_m2} — ABNORMAL LOW (ref >59)
GFR calc non Af Amer: 29 mL/min/{1.73_m2} — ABNORMAL LOW (ref >59)
Globulin, Total: 2.8 g/dL (ref 1.5–4.5)
Glucose: 115 mg/dL — ABNORMAL HIGH (ref 65–99)
POTASSIUM: 5.6 mmol/L — AB (ref 3.5–5.2)
Sodium: 139 mmol/L (ref 134–144)
Total Protein: 6.9 g/dL (ref 6.0–8.5)

## 2014-10-07 LAB — VITAMIN D 25 HYDROXY (VIT D DEFICIENCY, FRACTURES): Vit D, 25-Hydroxy: 26.2 ng/mL — ABNORMAL LOW (ref 30.0–100.0)

## 2014-10-09 ENCOUNTER — Encounter: Payer: Self-pay | Admitting: *Deleted

## 2014-10-09 ENCOUNTER — Other Ambulatory Visit: Payer: Self-pay | Admitting: Family

## 2014-10-10 ENCOUNTER — Telehealth: Payer: Self-pay | Admitting: Family

## 2014-10-10 NOTE — Telephone Encounter (Signed)
Stp and he is aware of the results and states he has a follow up with his endocrinologist and won't see Tammy, he also has his appt with the nephrologist.

## 2014-10-12 ENCOUNTER — Other Ambulatory Visit: Payer: Self-pay | Admitting: Family

## 2014-10-14 ENCOUNTER — Other Ambulatory Visit: Payer: Self-pay | Admitting: Family

## 2014-10-26 DIAGNOSIS — E1129 Type 2 diabetes mellitus with other diabetic kidney complication: Secondary | ICD-10-CM | POA: Diagnosis not present

## 2014-10-26 DIAGNOSIS — I129 Hypertensive chronic kidney disease with stage 1 through stage 4 chronic kidney disease, or unspecified chronic kidney disease: Secondary | ICD-10-CM | POA: Diagnosis not present

## 2014-10-26 DIAGNOSIS — E875 Hyperkalemia: Secondary | ICD-10-CM | POA: Diagnosis not present

## 2014-10-26 DIAGNOSIS — N183 Chronic kidney disease, stage 3 (moderate): Secondary | ICD-10-CM | POA: Diagnosis not present

## 2014-11-01 DIAGNOSIS — H16142 Punctate keratitis, left eye: Secondary | ICD-10-CM | POA: Diagnosis not present

## 2014-11-01 DIAGNOSIS — H04123 Dry eye syndrome of bilateral lacrimal glands: Secondary | ICD-10-CM | POA: Diagnosis not present

## 2014-11-01 DIAGNOSIS — H02204 Unspecified lagophthalmos left upper eyelid: Secondary | ICD-10-CM | POA: Diagnosis not present

## 2014-11-01 DIAGNOSIS — H4011X3 Primary open-angle glaucoma, severe stage: Secondary | ICD-10-CM | POA: Diagnosis not present

## 2014-11-24 ENCOUNTER — Encounter: Payer: Self-pay | Admitting: "Endocrinology

## 2014-11-29 DIAGNOSIS — I10 Essential (primary) hypertension: Secondary | ICD-10-CM | POA: Diagnosis not present

## 2014-11-29 DIAGNOSIS — E785 Hyperlipidemia, unspecified: Secondary | ICD-10-CM | POA: Diagnosis not present

## 2014-11-29 DIAGNOSIS — E1122 Type 2 diabetes mellitus with diabetic chronic kidney disease: Secondary | ICD-10-CM | POA: Diagnosis not present

## 2014-11-29 LAB — HEMOGLOBIN A1C: HEMOGLOBIN A1C: 9.8

## 2014-12-06 ENCOUNTER — Encounter: Payer: Self-pay | Admitting: "Endocrinology

## 2014-12-06 ENCOUNTER — Ambulatory Visit (INDEPENDENT_AMBULATORY_CARE_PROVIDER_SITE_OTHER): Payer: Commercial Managed Care - HMO | Admitting: "Endocrinology

## 2014-12-06 VITALS — BP 120/70 | HR 75 | Ht 71.0 in | Wt 193.0 lb

## 2014-12-06 DIAGNOSIS — E559 Vitamin D deficiency, unspecified: Secondary | ICD-10-CM | POA: Diagnosis not present

## 2014-12-06 DIAGNOSIS — N183 Chronic kidney disease, stage 3 (moderate): Secondary | ICD-10-CM | POA: Diagnosis not present

## 2014-12-06 DIAGNOSIS — E785 Hyperlipidemia, unspecified: Secondary | ICD-10-CM

## 2014-12-06 DIAGNOSIS — E1122 Type 2 diabetes mellitus with diabetic chronic kidney disease: Secondary | ICD-10-CM

## 2014-12-06 DIAGNOSIS — I10 Essential (primary) hypertension: Secondary | ICD-10-CM | POA: Diagnosis not present

## 2014-12-06 NOTE — Progress Notes (Signed)
  Subjective:    Patient ID: Barry Welch, male    DOB: 10/22/1940,    Past Medical History  Diagnosis Date  . Hypertension   . Hyperlipidemia   . Diabetes mellitus   . Diverticulosis   . CKD (chronic kidney disease)    Past Surgical History  Procedure Laterality Date  . Cataract extraction w/ intraocular lens  implant, bilateral  2012  . Knee surgery      ORIF LEFT KNEE    Social History   Social History  . Marital Status: Legally Separated    Spouse Name: [name]  . Number of Children: [name]  . Years of Education: [name]   Social History Main Topics  . Smoking status: Former Smoker    Quit date: 03/11/2007  . Smokeless tobacco: Never Used  . Alcohol Use: No  . Drug Use: No  . Sexual Activity: Not Asked   Other Topics Concern  . None   Social History Narrative   DISABLED X 12 YRS    SEPARATED X 12 YRS     5 CHILDREN         Outpatient Encounter Prescriptions as of 12/06/2014  Medication Sig  . aspirin 81 MG tablet Take 81 mg by mouth daily.    . atorvastatin (LIPITOR) 40 MG tablet TAKE ONE TABLET BY MOUTH ONE TIME DAILY  . benazepril (LOTENSIN) 10 MG tablet TAKE (1/2) TABLET DAILY.  . Blood Glucose Monitoring Suppl (ACCU-CHEK AVIVA PLUS) W/DEVICE KIT Use monitor to check blood sugar bid. Dx E11.9  . cholecalciferol (VITAMIN D) 1000 UNITS tablet Take 2,000 Units by mouth every morning.  . clobetasol ointment (TEMOVATE) 0.05 % Apply topically 2 (two) times daily.  . diclofenac sodium (VOLTAREN) 1 % GEL Apply 2 g topically 4 (four) times daily.  . fenofibrate (TRICOR) 145 MG tablet TAKE ONE TABLET BY MOUTH ONE TIME DAILY  . glucose blood (ACCU-CHEK AVIVA) test strip Use to test blood sugar bid. Dx E11.9  . Insulin Aspart Prot & Aspart (NOVOLOG MIX 70/30 FLEXPEN) (70-30) 100 UNIT/ML SUPN Inject 25 units each morning before breakfast and 18 units each evening before supper.  . Lancets (ACCU-CHEK SOFT TOUCH) lancets Use to test blood sugar bid. Dx E11.9  . Lancets 30G  MISC 1 Units by Does not apply route 2 (two) times daily.  . LUMIGAN 0.01 % SOLN Place 1 drop into both eyes daily.  . NOVOFINE 30G X 8 MM MISC   . Omega-3 300 MG CAPS Take 1 capsule by mouth 2 (two) times daily.  . PAZEO 0.7 % SOLN   . Polyethyl Glycol-Propyl Glycol (SYSTANE ULTRA OP) Apply 1 drop to eye as needed. Dry eyes  . TRADJENTA 5 MG TABS tablet Take 5 mg by mouth daily.   . diclofenac (VOLTAREN) 50 MG EC tablet Take 1 tablet (50 mg total) by mouth 2 (two) times daily. (Patient not taking: Reported on 12/06/2014)   No facility-administered encounter medications on file as of 12/06/2014.   ALLERGIES: No Known Allergies VACCINATION STATUS: Immunization History  Administered Date(s) Administered  . H1N1 03/21/2008  . Influenza Split 01/07/2011  . Influenza Whole 12/14/2008, 12/18/2009  . Influenza,inj,Quad PF,36+ Mos 12/24/2012, 12/21/2013  . Pneumococcal Conjugate-13 07/04/2014  . Pneumococcal Polysaccharide-23 01/07/2011  . Tdap 09/06/2010  . Zoster 06/23/2013    Diabetes He presents for his follow-up diabetic visit. He has type 2 diabetes mellitus. Onset time: diagnosed at approx age 68. Pertinent negatives for hypoglycemia include no confusion, headaches, pallor or   seizures. Pertinent negatives for diabetes include no chest pain, no fatigue, no polydipsia, no polyphagia, no polyuria and no weakness. Diabetic complications include a CVA, nephropathy and PVD. Risk factors for coronary artery disease include diabetes mellitus, dyslipidemia, male sex, tobacco exposure and sedentary lifestyle. He is compliant with treatment most of the time. His weight is stable. He is following a generally unhealthy diet. Meal planning includes avoidance of concentrated sweets. He rarely participates in exercise. His home blood glucose trend is decreasing steadily. His overall blood glucose range is 180-200 mg/dl. An ACE inhibitor/angiotensin II receptor blocker is being taken. Eye exam is current.   Hyperlipidemia This is a chronic problem. The current episode started more than 1 year ago. The problem is controlled. Pertinent negatives include no chest pain, myalgias or shortness of breath. Current antihyperlipidemic treatment includes statins. There are no compliance problems.   Hypertension This is a chronic problem. The current episode started more than 1 year ago. The problem is controlled. Pertinent negatives include no chest pain, headaches, neck pain, palpitations or shortness of breath. Past treatments include ACE inhibitors. Hypertensive end-organ damage includes CVA and PVD.     Review of Systems  Constitutional: Negative for fatigue and unexpected weight change.  HENT: Positive for dental problem. Negative for mouth sores and trouble swallowing.   Eyes: Negative for visual disturbance.  Respiratory: Negative for cough, choking, chest tightness, shortness of breath and wheezing.   Cardiovascular: Negative for chest pain, palpitations and leg swelling.  Gastrointestinal: Negative for nausea, vomiting, abdominal pain, diarrhea, constipation and abdominal distention.  Endocrine: Negative for polydipsia, polyphagia and polyuria.  Genitourinary: Negative for dysuria, urgency, hematuria and flank pain.  Musculoskeletal: Negative for myalgias, back pain, joint swelling, gait problem and neck pain.  Skin: Negative for pallor, rash and wound.  Neurological: Negative for seizures, syncope, weakness, numbness and headaches.  Psychiatric/Behavioral: Negative for suicidal ideas, hallucinations, confusion and dysphoric mood.    Objective:    BP 120/70 mmHg  Pulse 75  Ht 5' 11" (1.803 m)  Wt 193 lb (87.544 kg)  BMI 26.93 kg/m2  SpO2 96%  Wt Readings from Last 3 Encounters:  12/06/14 193 lb (87.544 kg)  09/04/14 196 lb 6.4 oz (89.086 kg)  10/06/14 195 lb (88.451 kg)    Physical Exam  Constitutional: He is oriented to person, place, and time. He appears well-developed and  well-nourished. He is cooperative.  HENT:  Head: Normocephalic and atraumatic.  Mouth/Throat: Abnormal dentition.  Eyes: EOM are normal.  Neck: Normal range of motion. Neck supple. No tracheal deviation present. No thyromegaly present.  Cardiovascular: Normal rate and normal heart sounds.  Exam reveals no gallop.   No murmur heard. Pulses:      Carotid pulses are 2+ on the right side.      Radial pulses are 2+ on the right side.       Dorsalis pedis pulses are 1+ on the right side.       Posterior tibial pulses are 1+ on the right side.  Pulmonary/Chest: Breath sounds normal. No respiratory distress. He has no wheezes.  Abdominal: Soft. Bowel sounds are normal. He exhibits no distension. There is no hepatosplenomegaly. There is no tenderness. There is no guarding and no CVA tenderness.  Musculoskeletal: He exhibits no edema.       Right shoulder: He exhibits no swelling and no deformity.  Lymphadenopathy:       Head (right side): No submental and no submandibular adenopathy present.         Head (left side): No submental and no submandibular adenopathy present.       Right cervical: No superficial cervical adenopathy present.      Left cervical: No superficial cervical adenopathy present.       Right: No supraclavicular adenopathy present.       Left: No supraclavicular adenopathy present.  Neurological: He is alert and oriented to person, place, and time. He has normal strength and normal reflexes. No cranial nerve deficit or sensory deficit. Gait normal.  Skin: Skin is warm and dry. No rash noted. No cyanosis. Nails show no clubbing.  Psychiatric: He has a normal mood and affect. His speech is normal and behavior is normal. Judgment and thought content normal. Cognition and memory are normal.    Results for orders placed or performed in visit on 11/24/14  Hemoglobin A1c  Result Value Ref Range   Hgb A1c MFr Bld 10.5 (A) 4.0 - 6.0 %   Complete Blood Count (Most recent): Lab Results   Component Value Date   WBC 6.0 12/21/2013   HGB 12.9* 12/21/2013   HCT 40.0* 12/21/2013   MCV 94.3 12/21/2013   PLT 234 09/27/2012   Chemistry (most recent): Lab Results  Component Value Date   NA 139 10/06/2014   K 5.6* 10/06/2014   CL 100 10/06/2014   CO2 20 10/06/2014   BUN 22 10/06/2014   CREATININE 2.17* 10/06/2014   Diabetic Labs (most recent): Lab Results  Component Value Date   HGBA1C 9.5 10/06/2014   HGBA1C 10.5* 08/28/2014   HGBA1C 9.9 07/04/2014   Lipid profile (most recent): Lab Results  Component Value Date   TRIG 111 10/06/2014   CHOL 129 10/06/2014        LABORATORY REVIEW: Complete Blood Count (Most recent): Lab Results  Component Value Date   WBC 6.0 12/21/2013   HGB 12.9* 12/21/2013   HCT 40.0* 12/21/2013   MCV 94.3 12/21/2013   PLT 234 09/27/2012   Chemistry (most recent): Lab Results  Component Value Date   NA 139 10/06/2014   K 5.6* 10/06/2014   CL 100 10/06/2014   CO2 20 10/06/2014   BUN 22 10/06/2014   CREATININE 2.17* 10/06/2014   Diabetic Labs (most recent): Lab Results  Component Value Date   HGBA1C 9.5 10/06/2014   HGBA1C 10.5* 08/28/2014   HGBA1C 9.9 07/04/2014   Lipid profile (most recent): Lab Results  Component Value Date   TRIG 111 10/06/2014   CHOL 129 10/06/2014      Assessment & Plan:   1. Type 2 diabetes mellitus with stage 3 chronic kidney disease His EAG is above target, a1c is at 9.8% , overall  improved from 13.8%.  - a1c may not be reliable in the face of advanced CKD.  His prior Fructosamine has changed to 330 from  a high of  529, correlates well with his high a1c.  His DM type 2 is complicated by CKD, and CVA. He remains at a high risk for more complications of type 2 DM.  He would need basal/bolus insulin, however due to his limited social support , I will continue with premixed insulin , Novolog 70 /30 , 40 units with breakfast and 25 units with supper.   continue Tradjenta 5 mg po qday.   I have recounseled him on carbohydrates management. I advised him to avoid over-correction for BG readings around 100. continue low dose aspirin.  Pt will monitor BG BID and RTN in 12 wks with labs, meter, logs  for reevaluation and insulin adjustment.- Comprehensive metabolic panel; Future - Hemoglobin A1c; Future  2. Essential hypertension Controlled,  BP 120/70. Continue  ACEI for HTN.  3. Hyperlipidemia continue Statin for dyslipidemia   4. Vitamin D deficiency - I advised him to continue Vitamin D supplement.   Follow up plan: Return in about 3 months (around 03/07/2015) for diabetes, high blood pressure, high cholesterol, with pre-visit labs, meter, and logs.  Glade Lloyd, MD Phone: [phone]  Fax: [phone]   12/06/2014, 3:05 PM

## 2014-12-11 ENCOUNTER — Other Ambulatory Visit: Payer: Self-pay | Admitting: "Endocrinology

## 2014-12-18 ENCOUNTER — Other Ambulatory Visit: Payer: Self-pay | Admitting: *Deleted

## 2014-12-18 MED ORDER — GLUCOSE BLOOD VI STRP: ORAL_STRIP | Status: DC

## 2015-01-08 ENCOUNTER — Ambulatory Visit (INDEPENDENT_AMBULATORY_CARE_PROVIDER_SITE_OTHER): Payer: Commercial Managed Care - HMO | Admitting: Family

## 2015-01-08 ENCOUNTER — Encounter: Payer: Self-pay | Admitting: Family

## 2015-01-08 VITALS — BP 128/75 | HR 78 | Temp 97.0°F | Ht 71.0 in | Wt 193.6 lb

## 2015-01-08 DIAGNOSIS — Z23 Encounter for immunization: Secondary | ICD-10-CM

## 2015-01-08 DIAGNOSIS — E785 Hyperlipidemia, unspecified: Secondary | ICD-10-CM | POA: Diagnosis not present

## 2015-01-08 DIAGNOSIS — L0291 Cutaneous abscess, unspecified: Secondary | ICD-10-CM | POA: Diagnosis not present

## 2015-01-08 DIAGNOSIS — E1122 Type 2 diabetes mellitus with diabetic chronic kidney disease: Secondary | ICD-10-CM | POA: Diagnosis not present

## 2015-01-08 DIAGNOSIS — N183 Chronic kidney disease, stage 3 unspecified: Secondary | ICD-10-CM

## 2015-01-08 DIAGNOSIS — E559 Vitamin D deficiency, unspecified: Secondary | ICD-10-CM

## 2015-01-08 DIAGNOSIS — J309 Allergic rhinitis, unspecified: Secondary | ICD-10-CM

## 2015-01-08 DIAGNOSIS — N184 Chronic kidney disease, stage 4 (severe): Secondary | ICD-10-CM | POA: Diagnosis not present

## 2015-01-08 DIAGNOSIS — M179 Osteoarthritis of knee, unspecified: Secondary | ICD-10-CM

## 2015-01-08 DIAGNOSIS — Z794 Long term (current) use of insulin: Secondary | ICD-10-CM

## 2015-01-08 DIAGNOSIS — M1711 Unilateral primary osteoarthritis, right knee: Secondary | ICD-10-CM

## 2015-01-08 DIAGNOSIS — I10 Essential (primary) hypertension: Secondary | ICD-10-CM

## 2015-01-08 LAB — POCT GLYCOSYLATED HEMOGLOBIN (HGB A1C): Hemoglobin A1C: 9.3

## 2015-01-08 MED ORDER — FLUTICASONE PROPIONATE 50 MCG/ACT NA SUSP: 2.0000 | Freq: Every day | NASAL | Status: DC

## 2015-01-08 NOTE — Progress Notes (Signed)
Subjective:    Patient ID: Barry Welch, male    DOB: 21-Feb-1941, 74 y.o.   MRN: [id number]  Pt presents to the office today for chronic follow up.  Diabetes He presents for his follow-up diabetic visit. He has type 2 diabetes mellitus. His disease course has been stable. Pertinent negatives for hypoglycemia include no confusion, dizziness or mood changes. Pertinent negatives for diabetes include no foot paresthesias, no foot ulcerations, no polyuria, no visual change and no weakness. Pertinent negatives for hypoglycemia complications include no blackouts and no hospitalization. Symptoms are stable. Diabetic complications include nephropathy. Pertinent negatives for diabetic complications include no CVA, heart disease, peripheral neuropathy or PVD. Risk factors for coronary artery disease include diabetes mellitus, dyslipidemia, hypertension and male sex. Current diabetic treatment includes insulin injections. He is compliant with treatment all of the time. He is following a diabetic diet. His breakfast blood glucose range is generally 130-140 mg/dl. An ACE inhibitor/angiotensin II receptor blocker is being taken. Eye exam is current.  Hypertension This is a chronic problem. The current episode started more than 1 year ago. The problem has been resolved since onset. The problem is controlled. Pertinent negatives include no anxiety, palpitations, peripheral edema or shortness of breath. Risk factors for coronary artery disease include diabetes mellitus, dyslipidemia, family history and male gender. Past treatments include ACE inhibitors. The current treatment provides moderate improvement. Hypertensive end-organ damage includes kidney disease. There is no history of CVA, heart failure, PVD or a thyroid problem. There is no history of sleep apnea.  Hyperlipidemia This is a chronic problem. The current episode started more than 1 year ago. The problem is controlled. Recent lipid tests were reviewed and  are normal. Exacerbating diseases include diabetes. He has no history of hypothyroidism. Factors aggravating his hyperlipidemia include fatty foods. Pertinent negatives include no leg pain, myalgias or shortness of breath. Current antihyperlipidemic treatment includes statins and fibric acid derivatives. The current treatment provides moderate improvement of lipids. Risk factors for coronary artery disease include diabetes mellitus, dyslipidemia, hypertension and male sex.      Review of Systems  Constitutional: Negative.   HENT: Negative.   Respiratory: Negative.  Negative for shortness of breath.   Cardiovascular: Negative.  Negative for palpitations.  Gastrointestinal: Negative.   Endocrine: Negative.  Negative for polyuria.  Genitourinary: Negative.   Musculoskeletal: Negative.  Negative for myalgias.  Neurological: Negative.  Negative for dizziness and weakness.  Hematological: Negative.   Psychiatric/Behavioral: Negative.  Negative for confusion.  All other systems reviewed and are negative.      Objective:   Physical Exam  Constitutional: He is oriented to person, place, and time. He appears well-developed and well-nourished. No distress.  HENT:  Head: Normocephalic.  Right Ear: External ear normal.  Left Ear: External ear normal.  Mouth/Throat: Oropharynx is clear and moist.  Nasal passage erythemas with mild swelling     Eyes: Pupils are equal, round, and reactive to light. Right eye exhibits no discharge. Left eye exhibits no discharge.  Neck: Normal range of motion. Neck supple. No thyromegaly present.  Cardiovascular: Normal rate, regular rhythm, normal heart sounds and intact distal pulses.   No murmur heard. Pulmonary/Chest: Effort normal and breath sounds normal. No respiratory distress. He has no wheezes.  Abdominal: Soft. Bowel sounds are normal. He exhibits no distension. There is no tenderness.  Musculoskeletal: Normal range of motion. He exhibits no edema or  tenderness.  Neurological: He is alert and oriented to person, place, and time. He has  normal reflexes. No cranial nerve deficit.  Skin: Skin is warm and dry. No rash noted. No erythema.  Fluid filled mass on right upper scapula- Abscess?   Psychiatric: He has a normal mood and affect. His behavior is normal. Judgment and thought content normal.  Vitals reviewed.   BP 128/75 mmHg  Pulse 78  Temp(Src) 97 F (36.1 C) (Oral)  Ht 5' 11"  (1.803 m)  Wt 193 lb 9.6 oz (87.816 kg)  BMI 27.01 kg/m2       Assessment & Plan:  1. Essential hypertension - CMP14+EGFR  2. Type 2 diabetes mellitus with stage 3 chronic kidney disease, with long-term current use of insulin (HCC) - POCT glycosylated hemoglobin (Hb A1C) - CMP14+EGFR  3. Osteoarthritis of right knee, unspecified osteoarthritis type - CMP14+EGFR  4. Chronic kidney disease (CKD), stage IV (severe) (HCC) - CMP14+EGFR  5. Hyperlipidemia  - CMP14+EGFR  6. Vitamin D deficiency - CMP14+EGFR - Vit D  25 hydroxy (rtn osteoporosis monitoring)  7. Allergic rhinitis, unspecified allergic rhinitis type  - fluticasone (FLONASE) 50 MCG/ACT nasal spray; Place 2 sprays into both nostrils daily.  Dispense: 16 g; Refill: 6  8. Abscess -If ultrasound confirms abscess- Will discuss if patient wants removed? No redness or pain per patient - [place] Misc Soft Tissue; Future   Continue all meds Labs pending Health Maintenance reviewed Diet and exercise encouraged RTO 3 month  Evelina Dun, FNP

## 2015-01-08 NOTE — Patient Instructions (Signed)

## 2015-01-09 ENCOUNTER — Other Ambulatory Visit: Payer: Self-pay | Admitting: Family

## 2015-01-09 LAB — CMP14+EGFR
ALK PHOS: 50 IU/L (ref 39–117)
ALT: 16 IU/L (ref 0–44)
AST: 24 IU/L (ref 0–40)
Albumin/Globulin Ratio: 1.3 (ref 1.1–2.5)
Albumin: 3.9 g/dL (ref 3.5–4.8)
BUN/Creatinine Ratio: 11 (ref 10–22)
BUN: 26 mg/dL (ref 8–27)
Bilirubin Total: 0.3 mg/dL (ref 0.0–1.2)
CO2: 24 mmol/L (ref 18–29)
CREATININE: 2.3 mg/dL — AB (ref 0.76–1.27)
Calcium: 9.4 mg/dL (ref 8.6–10.2)
Chloride: 102 mmol/L (ref 97–106)
GFR calc Af Amer: 31 mL/min/{1.73_m2} — ABNORMAL LOW (ref >59)
GFR calc non Af Amer: 27 mL/min/{1.73_m2} — ABNORMAL LOW (ref >59)
GLUCOSE: 124 mg/dL — AB (ref 65–99)
Globulin, Total: 3.1 g/dL (ref 1.5–4.5)
Potassium: 5.1 mmol/L (ref 3.5–5.2)
Sodium: 140 mmol/L (ref 136–144)
Total Protein: 7 g/dL (ref 6.0–8.5)

## 2015-01-09 LAB — VITAMIN D 25 HYDROXY (VIT D DEFICIENCY, FRACTURES): VIT D 25 HYDROXY: 28.7 ng/mL — AB (ref 30.0–100.0)

## 2015-01-12 ENCOUNTER — Ambulatory Visit (HOSPITAL_COMMUNITY)
Admission: RE | Admit: 2015-01-12 | Discharge: 2015-01-12 | Disposition: A | Discharge status: Home / Self Care | Payer: Commercial Managed Care - HMO | Source: Ambulatory Visit | Attending: Family | Admitting: Family

## 2015-01-12 DIAGNOSIS — L0291 Cutaneous abscess, unspecified: Secondary | ICD-10-CM | POA: Diagnosis not present

## 2015-01-12 DIAGNOSIS — R2231 Localized swelling, mass and lump, right upper limb: Secondary | ICD-10-CM | POA: Diagnosis not present

## 2015-01-16 ENCOUNTER — Other Ambulatory Visit: Payer: Self-pay | Admitting: Family

## 2015-01-16 DIAGNOSIS — M7989 Other specified soft tissue disorders: Secondary | ICD-10-CM

## 2015-02-13 ENCOUNTER — Ambulatory Visit: Payer: Self-pay | Admitting: General Surgery

## 2015-02-13 DIAGNOSIS — D170 Benign lipomatous neoplasm of skin and subcutaneous tissue of head, face and neck: Secondary | ICD-10-CM | POA: Diagnosis not present

## 2015-02-13 NOTE — H&P (Signed)
History of Present Illness Ralene Ok MD; 02/13/2015 1:27 PM) Patient words: New-soft tissue mass.  The patient is a 74 year old male who presents with a complaint of Mass. Patient is a 75 year old male who is referred by Donnetta Hutching, FNP for evaluation of a posterior neck mass. Patient states these have this there for multiple years, 10+. He states that he does have some pain. He does state that at skin bigger. Patient underwent ultrasound which revealed a fatty mass.   Other Problems Malachi Bonds, CMA; 02/13/2015 1:19 PM) Arthritis Diabetes Mellitus High blood pressure Hypercholesterolemia  Diagnostic Studies History Malachi Bonds, CMA; 02/13/2015 1:19 PM) Colonoscopy never  Allergies Malachi Bonds, CMA; 02/13/2015 1:19 PM) No Known Drug Allergies12/08/2014  Medication History (Chemira Jones, CMA; 02/13/2015 1:22 PM) Atorvastatin Calcium (40MG  Tablet, Oral) Active. Brimonidine Tartrate (0.15% Solution, Ophthalmic) Active. Diclofenac Sodium (50MG  Tablet DR, Oral) Active. Fenofibrate (145MG  Tablet, Oral) Active. Fluticasone Propionate (50MCG/ACT Suspension, Nasal) Active. NovoLOG Mix 70/30 FlexPen ((70-30) 100UNIT/ML Susp Pen-inj, Subcutaneous) Active. Tradjenta (5MG  Tablet, Oral) Active. Aspirin (81MG  Tablet, Oral) Active. Benazepril HCl (10MG  Tablet, Oral) Active. Vitamin D (1000UNIT Capsule, Oral) Active. Clobetasol Propionate (0.05% Ointment, External) Active. Lumigan (0.01% Solution, Ophthalmic) Active. Pazeo (0.7% Solution, Ophthalmic) Active. Systane (0.4-0.3% Solution, Ophthalmic) Active. Medications Reconciled  Social History Malachi Bonds, CMA; 02/13/2015 1:19 PM) Alcohol use Remotely quit alcohol use. Caffeine use Carbonated beverages, Coffee. Tobacco use Former smoker.  Family History Malachi Bonds, CMA; 02/13/2015 1:19 PM) Arthritis Brother. Breast Cancer Sister. Diabetes Mellitus Brother.    Review of Systems Malachi Bonds CMA; 02/13/2015 1:19 PM) General Not Present- Appetite Loss, Chills, Fatigue, Fever, Night Sweats, Weight Gain and Weight Loss. Skin Not Present- Change in Wart/Mole, Dryness, Hives, Jaundice, New Lesions, Non-Healing Wounds, Rash and Ulcer. HEENT Not Present- Earache, Hearing Loss, Hoarseness, Nose Bleed, Oral Ulcers, Ringing in the Ears, Seasonal Allergies, Sinus Pain, Sore Throat, Visual Disturbances, Wears glasses/contact lenses and Yellow Eyes. Respiratory Not Present- Bloody sputum, Chronic Cough, Difficulty Breathing, Snoring and Wheezing. Breast Not Present- Breast Mass, Breast Pain, Nipple Discharge and Skin Changes. Cardiovascular Not Present- Chest Pain, Difficulty Breathing Lying Down, Leg Cramps, Palpitations, Rapid Heart Rate, Shortness of Breath and Swelling of Extremities. Gastrointestinal Not Present- Abdominal Pain, Bloating, Bloody Stool, Change in Bowel Habits, Chronic diarrhea, Constipation, Difficulty Swallowing, Excessive gas, Gets full quickly at meals, Hemorrhoids, Indigestion, Nausea, Rectal Pain and Vomiting. Male Genitourinary Not Present- Blood in Urine, Change in Urinary Stream, Frequency, Impotence, Nocturia, Painful Urination, Urgency and Urine Leakage. Musculoskeletal Not Present- Back Pain, Joint Pain, Joint Stiffness, Muscle Pain, Muscle Weakness and Swelling of Extremities. Neurological Not Present- Decreased Memory, Fainting, Headaches, Numbness, Seizures, Tingling, Tremor, Trouble walking and Weakness. Psychiatric Not Present- Anxiety, Bipolar, Change in Sleep Pattern, Depression, Fearful and Frequent crying. Endocrine Not Present- Cold Intolerance, Excessive Hunger, Hair Changes, Heat Intolerance, Hot flashes and New Diabetes. Hematology Not Present- Easy Bruising, Excessive bleeding, Gland problems, HIV and Persistent Infections.  Vitals (Chemira Jones CMA; 02/13/2015 1:19 PM) 02/13/2015 1:19 PM Weight: 192.4 lb Height: 73in Body Surface Area: 2.12  m Body Mass Index: 25.38 kg/m  Temp.: 98.41F(Oral)  Pulse: 66 (Regular)  BP: 140/62 (Sitting, Left Arm, Standard)       Physical Exam Ralene Ok MD; 02/13/2015 1:27 PM) General Mental Status-Alert. General Appearance-Consistent with stated age. Hydration-Well hydrated. Voice-Normal.  Head and Neck Note: Approximately 5 x 4 cm subcutaneous mass, mobile, soft   Chest and Lung Exam Chest and lung exam reveals -quiet, even and easy respiratory  effort with no use of accessory muscles and on auscultation, normal breath sounds, no adventitious sounds and normal vocal resonance. Inspection Chest Wall - Normal. Back - normal.  Cardiovascular Cardiovascular examination reveals -normal heart sounds, regular rate and rhythm with no murmurs and normal pedal pulses bilaterally.  Abdomen Inspection Inspection of the abdomen reveals - No Hernias. Skin - Scar - no surgical scars. Palpation/Percussion Palpation and Percussion of the abdomen reveal - Soft, Non Tender, No Rebound tenderness, No Rigidity (guarding) and No hepatosplenomegaly. Auscultation Auscultation of the abdomen reveals - Bowel sounds normal.    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  1. The patient like to proceed to the operating room for excision of neck Mass. 2. Discussed with him the risks and benefits of the procedure to include but not limited to: Infection, bleeding, damage to structures structures, possible recurrence. The patient was understanding and wishes to proceed.

## 2015-02-26 ENCOUNTER — Other Ambulatory Visit: Payer: Self-pay | Admitting: "Endocrinology

## 2015-02-26 DIAGNOSIS — IMO0002 Reserved for concepts with insufficient information to code with codable children: Secondary | ICD-10-CM

## 2015-02-26 DIAGNOSIS — E1165 Type 2 diabetes mellitus with hyperglycemia: Secondary | ICD-10-CM

## 2015-02-26 DIAGNOSIS — E1169 Type 2 diabetes mellitus with other specified complication: Principal | ICD-10-CM

## 2015-02-28 DIAGNOSIS — E1169 Type 2 diabetes mellitus with other specified complication: Secondary | ICD-10-CM | POA: Diagnosis not present

## 2015-02-28 DIAGNOSIS — E1165 Type 2 diabetes mellitus with hyperglycemia: Secondary | ICD-10-CM | POA: Diagnosis not present

## 2015-02-28 LAB — HEMOGLOBIN A1C
Hgb A1c MFr Bld: 9.6 % — ABNORMAL HIGH (ref <5.7)
Mean Plasma Glucose: 229 mg/dL — ABNORMAL HIGH (ref <117)

## 2015-03-01 LAB — COMPREHENSIVE METABOLIC PANEL
ALBUMIN: 3.9 g/dL (ref 3.6–5.1)
ALT: 17 U/L (ref 9–46)
AST: 21 U/L (ref 10–35)
Alkaline Phosphatase: 43 U/L (ref 40–115)
BUN: 17 mg/dL (ref 7–25)
CALCIUM: 9.3 mg/dL (ref 8.6–10.3)
CHLORIDE: 104 mmol/L (ref 98–110)
CO2: 25 mmol/L (ref 20–31)
Creat: 1.64 mg/dL — ABNORMAL HIGH (ref 0.70–1.18)
Glucose, Bld: 121 mg/dL — ABNORMAL HIGH (ref 65–99)
POTASSIUM: 4.6 mmol/L (ref 3.5–5.3)
Sodium: 140 mmol/L (ref 135–146)
Total Bilirubin: 0.5 mg/dL (ref 0.2–1.2)
Total Protein: 7.4 g/dL (ref 6.1–8.1)

## 2015-03-07 ENCOUNTER — Ambulatory Visit (INDEPENDENT_AMBULATORY_CARE_PROVIDER_SITE_OTHER): Payer: Commercial Managed Care - HMO | Admitting: "Endocrinology

## 2015-03-07 ENCOUNTER — Encounter: Payer: Self-pay | Admitting: "Endocrinology

## 2015-03-07 VITALS — BP 136/75 | HR 78 | Ht 71.0 in | Wt 192.0 lb

## 2015-03-07 DIAGNOSIS — I10 Essential (primary) hypertension: Secondary | ICD-10-CM | POA: Diagnosis not present

## 2015-03-07 DIAGNOSIS — Z794 Long term (current) use of insulin: Secondary | ICD-10-CM

## 2015-03-07 DIAGNOSIS — E1122 Type 2 diabetes mellitus with diabetic chronic kidney disease: Secondary | ICD-10-CM | POA: Diagnosis not present

## 2015-03-07 DIAGNOSIS — E785 Hyperlipidemia, unspecified: Secondary | ICD-10-CM

## 2015-03-07 DIAGNOSIS — N183 Chronic kidney disease, stage 3 (moderate): Secondary | ICD-10-CM

## 2015-03-07 NOTE — Progress Notes (Signed)
Subjective:    Patient ID: Barry Welch, male    DOB: [birth year],    Past Medical History  Diagnosis Date  . Hypertension   . Hyperlipidemia   . Diabetes mellitus   . Diverticulosis   . CKD (chronic kidney disease)    Past Surgical History  Procedure Laterality Date  . Cataract extraction w/ intraocular lens  implant, bilateral  2012  . Knee surgery      ORIF LEFT KNEE    Social History   Social History  . Marital Status: Legally Separated    Spouse Name: [name]  . Number of Children: [name]  . Years of Education: [name]   Social History Main Topics  . Smoking status: Former Smoker    Quit date: 03/11/2007  . Smokeless tobacco: Never Used  . Alcohol Use: No  . Drug Use: No  . Sexual Activity: Not Asked   Other Topics Concern  . None   Social History Narrative   DISABLED X 12 YRS    SEPARATED X 12 YRS     5 CHILDREN         Outpatient Encounter Prescriptions as of 03/07/2015  Medication Sig  . aspirin 81 MG tablet Take 81 mg by mouth daily.    Marland Kitchen atorvastatin (LIPITOR) 40 MG tablet TAKE ONE TABLET BY MOUTH ONE TIME DAILY  . benazepril (LOTENSIN) 10 MG tablet TAKE (1/2) TABLET DAILY.  Marland Kitchen Blood Glucose Monitoring Suppl (ACCU-CHEK AVIVA PLUS) W/DEVICE KIT Use monitor to check blood sugar bid. Dx E11.9  . cholecalciferol (VITAMIN D) 1000 UNITS tablet Take 2,000 Units by mouth every morning.  . clobetasol ointment (TEMOVATE) 0.05 % Apply topically 2 (two) times daily.  . diclofenac (VOLTAREN) 50 MG EC tablet Take 1 tablet (50 mg total) by mouth 2 (two) times daily.  . diclofenac sodium (VOLTAREN) 1 % GEL Apply 2 g topically 4 (four) times daily.  . fenofibrate (TRICOR) 145 MG tablet TAKE ONE TABLET BY MOUTH ONE TIME DAILY  . fluticasone (FLONASE) 50 MCG/ACT nasal spray Place 2 sprays into both nostrils daily.  Marland Kitchen glucose blood (ONETOUCH VERIO) test strip Test blood sugar bid. DX E11.9  . Insulin Aspart Prot & Aspart (NOVOLOG MIX 70/30 FLEXPEN) (70-30) 100 UNIT/ML SUPN  Inject 40 units each morning before breakfast and 25 units each evening before supper.  . Lancets (ACCU-CHEK SOFT TOUCH) lancets Use to test blood sugar bid. Dx E11.9  . Lancets 30G MISC 1 Units by Does not apply route 2 (two) times daily.  Marland Kitchen LUMIGAN 0.01 % SOLN Place 1 drop into both eyes daily.  Marland Kitchen NOVOFINE 30G X 8 MM MISC   . Omega-3 300 MG CAPS Take 1 capsule by mouth 2 (two) times daily.  Marland Kitchen PAZEO 0.7 % SOLN   . Polyethyl Glycol-Propyl Glycol (SYSTANE ULTRA OP) Apply 1 drop to eye as needed. Dry eyes  . TRADJENTA 5 MG TABS tablet TAKE ONE TABLET BY MOUTH ONE TIME DAILY   No facility-administered encounter medications on file as of 03/07/2015.   ALLERGIES: No Known Allergies VACCINATION STATUS: Immunization History  Administered Date(s) Administered  . H1N1 03/21/2008  . Influenza Split 01/07/2011  . Influenza Whole 12/14/2008, 12/18/2009  . Influenza,inj,Quad PF,36+ Mos 12/24/2012, 12/21/2013, 01/08/2015  . Pneumococcal Conjugate-13 07/04/2014  . Pneumococcal Polysaccharide-23 01/07/2011  . Tdap 09/06/2010  . Zoster 06/23/2013    Diabetes He presents for his follow-up diabetic visit. He has type 2 diabetes mellitus. Onset time: diagnosed at approx age 51. His  disease course has been improving. Pertinent negatives for hypoglycemia include no confusion, headaches, pallor or seizures. Pertinent negatives for diabetes include no chest pain, no fatigue, no polydipsia, no polyphagia, no polyuria and no weakness. Symptoms are improving. Diabetic complications include a CVA, nephropathy and PVD. Risk factors for coronary artery disease include diabetes mellitus, dyslipidemia, male sex, tobacco exposure and sedentary lifestyle. He is compliant with treatment most of the time. His weight is stable. He is following a generally unhealthy diet. Meal planning includes avoidance of concentrated sweets. He rarely participates in exercise. His home blood glucose trend is decreasing steadily. His  breakfast blood glucose range is generally 140-180 mg/dl. His overall blood glucose range is 180-200 mg/dl. An ACE inhibitor/angiotensin II receptor blocker is being taken. Eye exam is current.  Hyperlipidemia This is a chronic problem. The current episode started more than 1 year ago. The problem is controlled. Pertinent negatives include no chest pain, myalgias or shortness of breath. Current antihyperlipidemic treatment includes statins. There are no compliance problems.   Hypertension This is a chronic problem. The current episode started more than 1 year ago. The problem is controlled. Pertinent negatives include no chest pain, headaches, neck pain, palpitations or shortness of breath. Past treatments include ACE inhibitors. Hypertensive end-organ damage includes CVA and PVD.     Review of Systems  Constitutional: Negative for fatigue and unexpected weight change.  HENT: Positive for dental problem. Negative for mouth sores and trouble swallowing.   Eyes: Negative for visual disturbance.  Respiratory: Negative for cough, choking, chest tightness, shortness of breath and wheezing.   Cardiovascular: Negative for chest pain, palpitations and leg swelling.  Gastrointestinal: Negative for nausea, vomiting, abdominal pain, diarrhea, constipation and abdominal distention.  Endocrine: Negative for polydipsia, polyphagia and polyuria.  Genitourinary: Negative for dysuria, urgency, hematuria and flank pain.  Musculoskeletal: Negative for myalgias, back pain, joint swelling, gait problem and neck pain.  Skin: Negative for pallor, rash and wound.  Neurological: Negative for seizures, syncope, weakness, numbness and headaches.  Psychiatric/Behavioral: Negative for suicidal ideas, hallucinations, confusion and dysphoric mood.    Objective:    BP 136/75 mmHg  Pulse 78  Ht _0  (1.803 m)  Wt 192 lb (87.091 kg)  BMI 26.79 kg/m2  SpO2 97%  Wt Readings from Last 3 Encounters:  03/07/15 192 lb  (87.091 kg)  01/08/15 193 lb 9.6 oz (87.816 kg)  12/06/14 193 lb (87.544 kg)    Physical Exam  Constitutional: He is oriented to person, place, and time. He appears well-developed and well-nourished. He is cooperative.  HENT:  Head: Normocephalic and atraumatic.  Mouth/Throat: Abnormal dentition.  Eyes: EOM are normal.  Neck: Normal range of motion. Neck supple. No tracheal deviation present. No thyromegaly present.  Cardiovascular: Normal rate and normal heart sounds.  Exam reveals no gallop.   No murmur heard. Pulses:      Carotid pulses are 2+ on the right side.      Radial pulses are 2+ on the right side.       Dorsalis pedis pulses are 1+ on the right side.       Posterior tibial pulses are 1+ on the right side.  Pulmonary/Chest: Breath sounds normal. No respiratory distress. He has no wheezes.  Abdominal: Soft. Bowel sounds are normal. He exhibits no distension. There is no hepatosplenomegaly. There is no tenderness. There is no guarding and no CVA tenderness.  Musculoskeletal: He exhibits no edema.       Right shoulder: He exhibits  no swelling and no deformity.  Lymphadenopathy:       Head (right side): No submental and no submandibular adenopathy present.       Head (left side): No submental and no submandibular adenopathy present.       Right cervical: No superficial cervical adenopathy present.      Left cervical: No superficial cervical adenopathy present.       Right: No supraclavicular adenopathy present.       Left: No supraclavicular adenopathy present.  Neurological: He is alert and oriented to person, place, and time. He has normal strength and normal reflexes. No cranial nerve deficit or sensory deficit. Gait normal.  Skin: Skin is warm and dry. No rash noted. No cyanosis. Nails show no clubbing.  Psychiatric: He has a normal mood and affect. His speech is normal and behavior is normal. Judgment and thought content normal. Cognition and memory are normal.    Chemistry (most recent): Lab Results  Component Value Date   NA 140 02/28/2015   K 4.6 02/28/2015   CL 104 02/28/2015   CO2 25 02/28/2015   BUN 17 02/28/2015   CREATININE 1.64* 02/28/2015   Diabetic Labs (most recent): Lab Results  Component Value Date   HGBA1C 9.6* 02/28/2015   HGBA1C 9.3 01/08/2015   HGBA1C 9.8 11/29/2014   Lipid Panel     Component Value Date/Time   CHOL 129 10/06/2014 0948   CHOL 121 04/22/2013 1603   CHOL 173 09/23/2012 1302   TRIG 111 10/06/2014 0948   TRIG 101 04/22/2013 1603   TRIG 177* 09/23/2012 1302   HDL 30* 10/06/2014 0948   HDL 27* 04/22/2013 1603   HDL 32* 09/23/2012 1302   HDL 34* 09/06/2010 1005   CHOLHDL 4.3 10/06/2014 0948   CHOLHDL 4.7 09/06/2010 1005   VLDL 34 09/06/2010 1005   LDLCALC 77 10/06/2014 0948   LDLCALC 74 04/22/2013 1603   LDLCALC 106* 09/23/2012 1302   LDLCALC 92 09/06/2010 1005     Assessment & Plan:   1. Type 2 diabetes mellitus with stage 3 chronic kidney disease His EAG is above target, a1c is at 9.6% , overall  improved from 13.8%.  - a1c may not be reliable in the face of advanced CKD.  His prior Fructosamine has changed to 330 from  a high of  529, correlates well with his high a1c.  His DM type 2 is complicated by CKD, and CVA. He remains at a high risk for more complications of type 2 DM.  He would need basal/bolus insulin, however due to his limited social support , I will continue with premixed insulin , Novolog 70 /30 , 40 units with breakfast and 25 units with supper.   continue Tradjenta 5 mg po qday.  I have recounseled him on carbohydrates management. I advised him to avoid over-correction for BG readings around 100. continue low dose aspirin.  Pt will monitor BG BID and RTN in 12 wks with labs, meter, logs for reevaluation and insulin adjustment.  2. Essential hypertension Controlled,  BP 120/70. Continue  ACEI for HTN.  3. Hyperlipidemia continue Statin for dyslipidemia   4. Vitamin  D deficiency - I advised him to continue Vitamin D supplement.   Follow up plan: Return in about 3 months (around 06/05/2015) for diabetes, high blood pressure, high cholesterol, follow up with pre-visit labs, meter, and logs.  Glade Lloyd, MD Phone: [phone]  Fax: [phone]   03/07/2015, 4:41 PM

## 2015-03-07 NOTE — Patient Instructions (Signed)
Advice for weight management -For most of [place] the best way to control glucose  is by diet management. Generally speaking, diet management means restricting carbohydrate consumption to minimum possible (and to unprocessed or minimally processed complex starch) and increasing protein intake (animal or plant source), fruits, and vegetables.  -Sticking to a routine mealtime to eat 3 meals a day and avoiding unnecessary snacks is shown to have a big role in weight control.  -It is better to avoid simple carbohydrates including: Cakes, Desserts, Ice Cream, Soda (diet and regular), Sweet Tea, Candies, Chips, Cookies, Artificial Sweeteners, and "Sugar-free" Products.   -Exercise: 30 minutes a day 3-4 days a week, or 150 minutes a week. Combine stretch, strength, and aerobic activities. You may seek evaluation by your heart doctor prior to initiating exercise if you have high risk for heart disease.  -If you are interested, we can schedule a visit with Jearld Fenton, RDN, CDE for individualized nutrition education.

## 2015-03-12 ENCOUNTER — Other Ambulatory Visit: Payer: Self-pay | Admitting: "Endocrinology

## 2015-03-23 NOTE — Patient Instructions (Signed)
Barry Welch  03/23/2015   Your procedure is scheduled on: 03/28/2015    Report to [facility] Main  Entrance take [place]  elevators to 3rd floor to  [place] at    [place] AM.  Call this number if you have problems the morning of surgery [phone]   Remember: ONLY 1 PERSON MAY GO WITH YOU TO SHORT STAY TO GET  READY MORNING OF YOUR SURGERY.  Do not eat food or drink liquids :After Midnight.             Eat a good healthy snack prior to bedtime.               Take 1/2 of evening dose of Insulin nite before surgery.       Take these medicines the morning of surgery with A SIP OF WATER: Alphagan eye drops, Patanol eye drops if needed, Systane eye drops if needed  DO NOT TAKE ANY DIABETIC MEDICATIONS DAY OF YOUR SURGERY                               You may not have any metal on your body including hair pins and              piercings  Do not wear jewelry,  lotions, powders or perfumes, deodorant                         Men may shave face and neck.   Do not bring valuables to the hospital. [place].  Contacts, dentures or bridgework may not be worn into surgery.       Patients discharged the day of surgery will not be allowed to drive home.  Name and phone number of your driver:  Special Instructions:  Coughing and deep breathing exercises, leg exercises               Please read over the following fact sheets you were given: _____________________________________________________________________             [facility] - Preparing for Surgery Before surgery, you can play an important role.  Because skin is not sterile, your skin needs to be as free of germs as possible.  You can reduce the number of germs on your skin by washing with CHG (chlorahexidine gluconate) soap before surgery.  CHG is an antiseptic cleaner which kills germs and bonds with the skin to continue killing germs even after  washing. Please DO NOT use if you have an allergy to CHG or antibacterial soaps.  If your skin becomes reddened/irritated stop using the CHG and inform your nurse when you arrive at Short Stay. Do not shave (including legs and underarms) for at least 48 hours prior to the first CHG shower.  You may shave your face/neck. Please follow these instructions carefully:  1.  Shower with CHG Soap the night before surgery and the  morning of Surgery.  2.  If you choose to wash your hair, wash your hair first as usual with your  normal  shampoo.  3.  After you shampoo, rinse your hair and body thoroughly to remove the  shampoo.  4.  Use CHG as you would any other liquid soap.  You can apply chg directly  to the skin and wash                       Gently with a scrungie or clean washcloth.  5.  Apply the CHG Soap to your body ONLY FROM THE NECK DOWN.   Do not use on face/ open                           Wound or open sores. Avoid contact with eyes, ears mouth and genitals (private parts).                       Wash face,  Genitals (private parts) with your normal soap.             6.  Wash thoroughly, paying special attention to the area where your surgery  will be performed.  7.  Thoroughly rinse your body with warm water from the neck down.  8.  DO NOT shower/wash with your normal soap after using and rinsing off  the CHG Soap.                9.  Pat yourself dry with a clean towel.            10.  Wear clean pajamas.            11.  Place clean sheets on your bed the night of your first shower and do not  sleep with pets. Day of Surgery : Do not apply any lotions/deodorants the morning of surgery.  Please wear clean clothes to the hospital/surgery center.  FAILURE TO FOLLOW THESE INSTRUCTIONS MAY RESULT IN THE CANCELLATION OF YOUR SURGERY PATIENT SIGNATURE_________________________________  NURSE  SIGNATURE__________________________________  ________________________________________________________________________

## 2015-03-26 ENCOUNTER — Encounter (HOSPITAL_COMMUNITY): Payer: Self-pay

## 2015-03-26 ENCOUNTER — Encounter (HOSPITAL_COMMUNITY)
Admission: RE | Admit: 2015-03-26 | Discharge: 2015-03-26 | Disposition: A | Discharge status: Home / Self Care | Payer: Commercial Managed Care - HMO | Source: Ambulatory Visit | Attending: General Surgery | Admitting: General Surgery

## 2015-03-26 DIAGNOSIS — N189 Chronic kidney disease, unspecified: Secondary | ICD-10-CM | POA: Diagnosis not present

## 2015-03-26 DIAGNOSIS — Z7982 Long term (current) use of aspirin: Secondary | ICD-10-CM | POA: Diagnosis not present

## 2015-03-26 DIAGNOSIS — I10 Essential (primary) hypertension: Secondary | ICD-10-CM | POA: Diagnosis not present

## 2015-03-26 DIAGNOSIS — R9431 Abnormal electrocardiogram [ECG] [EKG]: Secondary | ICD-10-CM | POA: Diagnosis not present

## 2015-03-26 DIAGNOSIS — E7800 Pure hypercholesterolemia, unspecified: Secondary | ICD-10-CM | POA: Diagnosis not present

## 2015-03-26 DIAGNOSIS — E119 Type 2 diabetes mellitus without complications: Secondary | ICD-10-CM | POA: Diagnosis not present

## 2015-03-26 DIAGNOSIS — M1990 Unspecified osteoarthritis, unspecified site: Secondary | ICD-10-CM | POA: Diagnosis not present

## 2015-03-26 DIAGNOSIS — D171 Benign lipomatous neoplasm of skin and subcutaneous tissue of trunk: Secondary | ICD-10-CM | POA: Diagnosis not present

## 2015-03-26 DIAGNOSIS — R221 Localized swelling, mass and lump, neck: Secondary | ICD-10-CM | POA: Diagnosis present

## 2015-03-26 DIAGNOSIS — I129 Hypertensive chronic kidney disease with stage 1 through stage 4 chronic kidney disease, or unspecified chronic kidney disease: Secondary | ICD-10-CM | POA: Diagnosis not present

## 2015-03-26 DIAGNOSIS — Z794 Long term (current) use of insulin: Secondary | ICD-10-CM | POA: Diagnosis not present

## 2015-03-26 DIAGNOSIS — Z79899 Other long term (current) drug therapy: Secondary | ICD-10-CM | POA: Diagnosis not present

## 2015-03-26 DIAGNOSIS — Z87891 Personal history of nicotine dependence: Secondary | ICD-10-CM | POA: Diagnosis not present

## 2015-03-26 HISTORY — DX: Unspecified osteoarthritis, unspecified site: M19.90

## 2015-03-26 HISTORY — DX: Gout, unspecified: M10.9

## 2015-03-26 HISTORY — DX: Headache, unspecified: R51.9

## 2015-03-26 HISTORY — DX: Headache: R51

## 2015-03-26 LAB — CBC
HCT: 40.2 % (ref 39.0–52.0)
HEMOGLOBIN: 12.6 g/dL — AB (ref 13.0–17.0)
MCH: 30.7 pg (ref 26.0–34.0)
MCHC: 31.3 g/dL (ref 30.0–36.0)
MCV: 97.8 fL (ref 78.0–100.0)
PLATELETS: 272 10*3/uL (ref 150–400)
RBC: 4.11 MIL/uL — AB (ref 4.22–5.81)
RDW: 14 % (ref 11.5–15.5)
WBC: 4.9 10*3/uL (ref 4.0–10.5)

## 2015-03-26 LAB — BASIC METABOLIC PANEL
ANION GAP: 10 (ref 5–15)
BUN: 27 mg/dL — ABNORMAL HIGH (ref 6–20)
CALCIUM: 9.5 mg/dL (ref 8.9–10.3)
CO2: 24 mmol/L (ref 22–32)
CREATININE: 2.09 mg/dL — AB (ref 0.61–1.24)
Chloride: 109 mmol/L (ref 101–111)
GFR, EST AFRICAN AMERICAN: 34 mL/min — AB (ref >60)
GFR, EST NON AFRICAN AMERICAN: 29 mL/min — AB (ref >60)
GLUCOSE: 89 mg/dL (ref 65–99)
Potassium: 4.7 mmol/L (ref 3.5–5.1)
Sodium: 143 mmol/L (ref 135–145)

## 2015-03-26 NOTE — Progress Notes (Addendum)
02-28-15 - HgbA1C (9.6) - EPIC 02-28-15 - CMP (Creatinine 1.64) - EPIC  03-07-15 - LOV - Dr. Dorris Fetch (endoc) - EPIC 01-08-15 - LOV Kayren Eaves, FNP - EPIC

## 2015-03-26 NOTE — Progress Notes (Signed)
03-26-15 - BMP results from preop appt. On 03-26-15 faxed to Dr. Rosendo Gros via [facility]

## 2015-03-28 ENCOUNTER — Ambulatory Visit (HOSPITAL_COMMUNITY): Payer: Commercial Managed Care - HMO | Admitting: Anesthesiology

## 2015-03-28 ENCOUNTER — Encounter (HOSPITAL_COMMUNITY)
Admission: RE | Disposition: A | Discharge status: Home / Self Care | Payer: Self-pay | Source: Ambulatory Visit | Attending: General Surgery

## 2015-03-28 ENCOUNTER — Ambulatory Visit (HOSPITAL_COMMUNITY)
Admission: RE | Admit: 2015-03-28 | Discharge: 2015-03-28 | Disposition: A | Discharge status: Home / Self Care | Payer: Commercial Managed Care - HMO | Source: Ambulatory Visit | Attending: General Surgery | Admitting: General Surgery

## 2015-03-28 ENCOUNTER — Encounter (HOSPITAL_COMMUNITY): Payer: Self-pay | Admitting: *Deleted

## 2015-03-28 DIAGNOSIS — Z7982 Long term (current) use of aspirin: Secondary | ICD-10-CM | POA: Insufficient documentation

## 2015-03-28 DIAGNOSIS — I10 Essential (primary) hypertension: Secondary | ICD-10-CM | POA: Insufficient documentation

## 2015-03-28 DIAGNOSIS — M1990 Unspecified osteoarthritis, unspecified site: Secondary | ICD-10-CM | POA: Insufficient documentation

## 2015-03-28 DIAGNOSIS — Z87891 Personal history of nicotine dependence: Secondary | ICD-10-CM | POA: Diagnosis not present

## 2015-03-28 DIAGNOSIS — D171 Benign lipomatous neoplasm of skin and subcutaneous tissue of trunk: Secondary | ICD-10-CM | POA: Diagnosis not present

## 2015-03-28 DIAGNOSIS — R221 Localized swelling, mass and lump, neck: Secondary | ICD-10-CM | POA: Diagnosis not present

## 2015-03-28 DIAGNOSIS — E7800 Pure hypercholesterolemia, unspecified: Secondary | ICD-10-CM | POA: Diagnosis not present

## 2015-03-28 DIAGNOSIS — I129 Hypertensive chronic kidney disease with stage 1 through stage 4 chronic kidney disease, or unspecified chronic kidney disease: Secondary | ICD-10-CM | POA: Diagnosis not present

## 2015-03-28 DIAGNOSIS — N189 Chronic kidney disease, unspecified: Secondary | ICD-10-CM | POA: Insufficient documentation

## 2015-03-28 DIAGNOSIS — E119 Type 2 diabetes mellitus without complications: Secondary | ICD-10-CM | POA: Diagnosis not present

## 2015-03-28 DIAGNOSIS — Z794 Long term (current) use of insulin: Secondary | ICD-10-CM | POA: Insufficient documentation

## 2015-03-28 DIAGNOSIS — R9431 Abnormal electrocardiogram [ECG] [EKG]: Secondary | ICD-10-CM | POA: Insufficient documentation

## 2015-03-28 DIAGNOSIS — D170 Benign lipomatous neoplasm of skin and subcutaneous tissue of head, face and neck: Secondary | ICD-10-CM | POA: Diagnosis not present

## 2015-03-28 DIAGNOSIS — Z79899 Other long term (current) drug therapy: Secondary | ICD-10-CM | POA: Insufficient documentation

## 2015-03-28 HISTORY — PX: MASS EXCISION: SHX2000

## 2015-03-28 LAB — GLUCOSE, CAPILLARY
GLUCOSE-CAPILLARY: 224 mg/dL — AB (ref 65–99)
Glucose-Capillary: 172 mg/dL — ABNORMAL HIGH (ref 65–99)

## 2015-03-28 SURGERY — EXCISION MASS
Anesthesia: General | Site: Neck

## 2015-03-28 MED ORDER — OXYCODONE HCL 5 MG PO TABS: 5.0000 mg | ORAL_TABLET | ORAL | Status: DC | PRN

## 2015-03-28 MED ORDER — ONDANSETRON HCL 4 MG/2ML IJ SOLN
INTRAMUSCULAR | Status: AC
  Filled 2015-03-28: qty 2

## 2015-03-28 MED ORDER — ONDANSETRON HCL 4 MG/2ML IJ SOLN
INTRAMUSCULAR | Status: DC | PRN
  Administered 2015-03-28: 4 mg via INTRAVENOUS

## 2015-03-28 MED ORDER — BUPIVACAINE-EPINEPHRINE (PF) 0.25% -1:200000 IJ SOLN
INTRAMUSCULAR | Status: AC
  Filled 2015-03-28: qty 30

## 2015-03-28 MED ORDER — SODIUM CHLORIDE 0.9 % IJ SOLN: 3.0000 mL | INTRAMUSCULAR | Status: DC | PRN

## 2015-03-28 MED ORDER — FENTANYL CITRATE (PF) 100 MCG/2ML IJ SOLN
INTRAMUSCULAR | Status: AC
  Filled 2015-03-28: qty 2

## 2015-03-28 MED ORDER — OXYCODONE-ACETAMINOPHEN 5-325 MG PO TABS: 1.0000 | ORAL_TABLET | ORAL | Status: DC | PRN

## 2015-03-28 MED ORDER — BUPIVACAINE HCL (PF) 0.25 % IJ SOLN
INTRAMUSCULAR | Status: DC | PRN
  Administered 2015-03-28: 10 mL

## 2015-03-28 MED ORDER — MORPHINE SULFATE (PF) 10 MG/ML IV SOLN: 2.0000 mg | INTRAVENOUS | Status: DC | PRN

## 2015-03-28 MED ORDER — SUCCINYLCHOLINE CHLORIDE 20 MG/ML IJ SOLN
INTRAMUSCULAR | Status: DC | PRN
  Administered 2015-03-28: 100 mg via INTRAVENOUS

## 2015-03-28 MED ORDER — ONDANSETRON HCL 4 MG/2ML IJ SOLN: 4.0000 mg | Freq: Once | INTRAMUSCULAR | Status: DC | PRN

## 2015-03-28 MED ORDER — ACETAMINOPHEN 325 MG PO TABS: 650.0000 mg | ORAL_TABLET | ORAL | Status: DC | PRN

## 2015-03-28 MED ORDER — SODIUM CHLORIDE 0.9 % IV SOLN: 250.0000 mL | INTRAVENOUS | Status: DC | PRN

## 2015-03-28 MED ORDER — PROPOFOL 10 MG/ML IV BOLUS
INTRAVENOUS | Status: AC
  Filled 2015-03-28: qty 20

## 2015-03-28 MED ORDER — HYDROCODONE-ACETAMINOPHEN 7.5-325 MG PO TABS: 1.0000 | ORAL_TABLET | Freq: Once | ORAL | Status: DC | PRN

## 2015-03-28 MED ORDER — PROPOFOL 10 MG/ML IV BOLUS
INTRAVENOUS | Status: DC | PRN
  Administered 2015-03-28: 150 mg via INTRAVENOUS

## 2015-03-28 MED ORDER — CEFAZOLIN SODIUM-DEXTROSE 2-3 GM-% IV SOLR
INTRAVENOUS | Status: AC
  Filled 2015-03-28: qty 50

## 2015-03-28 MED ORDER — LIDOCAINE HCL (CARDIAC) 20 MG/ML IV SOLN
INTRAVENOUS | Status: DC | PRN
  Administered 2015-03-28: 50 mg via INTRATRACHEAL

## 2015-03-28 MED ORDER — LACTATED RINGERS IV SOLN
INTRAVENOUS | Status: DC
  Administered 2015-03-28: 13:00:00 via INTRAVENOUS
  Administered 2015-03-28: 1000 mL via INTRAVENOUS

## 2015-03-28 MED ORDER — CHLORHEXIDINE GLUCONATE 4 % EX LIQD: 1.0000 "application " | Freq: Once | CUTANEOUS | Status: DC

## 2015-03-28 MED ORDER — CEFAZOLIN SODIUM-DEXTROSE 2-3 GM-% IV SOLR
2.0000 g | INTRAVENOUS | Status: AC
  Administered 2015-03-28: 2 g via INTRAVENOUS

## 2015-03-28 MED ORDER — PHENYLEPHRINE 40 MCG/ML (10ML) SYRINGE FOR IV PUSH (FOR BLOOD PRESSURE SUPPORT)
PREFILLED_SYRINGE | INTRAVENOUS | Status: AC
  Filled 2015-03-28: qty 10

## 2015-03-28 MED ORDER — ACETAMINOPHEN 650 MG RE SUPP
650.0000 mg | RECTAL | Status: DC | PRN
  Filled 2015-03-28: qty 1

## 2015-03-28 MED ORDER — 0.9 % SODIUM CHLORIDE (POUR BTL) OPTIME
TOPICAL | Status: DC | PRN
  Administered 2015-03-28: 1000 mL

## 2015-03-28 MED ORDER — LIDOCAINE HCL (CARDIAC) 20 MG/ML IV SOLN
INTRAVENOUS | Status: AC
  Filled 2015-03-28: qty 5

## 2015-03-28 MED ORDER — FENTANYL CITRATE (PF) 100 MCG/2ML IJ SOLN
INTRAMUSCULAR | Status: DC | PRN
  Administered 2015-03-28: 50 ug via INTRAVENOUS
  Administered 2015-03-28 (×2): 25 ug via INTRAVENOUS

## 2015-03-28 MED ORDER — FENTANYL CITRATE (PF) 100 MCG/2ML IJ SOLN: 25.0000 ug | INTRAMUSCULAR | Status: DC | PRN

## 2015-03-28 MED ORDER — PHENYLEPHRINE HCL 10 MG/ML IJ SOLN
INTRAMUSCULAR | Status: DC | PRN
  Administered 2015-03-28: 80 ug via INTRAVENOUS

## 2015-03-28 MED ORDER — SODIUM CHLORIDE 0.9 % IJ SOLN: 3.0000 mL | Freq: Two times a day (BID) | INTRAMUSCULAR | Status: DC

## 2015-03-28 SURGICAL SUPPLY — 30 items
BLADE HEX COATED 2.75 (ELECTRODE) ×3 IMPLANT
BLADE SURG 15 STRL LF DISP TIS (BLADE) ×1 IMPLANT
BLADE SURG 15 STRL SS (BLADE) ×2
COVER SURGICAL LIGHT HANDLE (MISCELLANEOUS) ×3 IMPLANT
DRAPE LAPAROTOMY T 102X78X121 (DRAPES) ×3 IMPLANT
DRAPE SHEET LG 3/4 BI-LAMINATE (DRAPES) IMPLANT
ELECT PENCIL ROCKER SW 15FT (MISCELLANEOUS) ×3 IMPLANT
ELECT REM PT RETURN 9FT ADLT (ELECTROSURGICAL) ×3
ELECTRODE REM PT RTRN 9FT ADLT (ELECTROSURGICAL) ×1 IMPLANT
GAUZE SPONGE 4X4 12PLY STRL (GAUZE/BANDAGES/DRESSINGS) IMPLANT
GLOVE BIO SURGEON STRL SZ7.5 (GLOVE) ×3 IMPLANT
GLOVE BIOGEL PI IND STRL 7.0 (GLOVE) ×2 IMPLANT
GLOVE BIOGEL PI INDICATOR 7.0 (GLOVE) ×4
GOWN STRL REUS W/TWL XL LVL3 (GOWN DISPOSABLE) ×6 IMPLANT
KIT BASIN OR (CUSTOM PROCEDURE TRAY) ×3 IMPLANT
LIQUID BAND (GAUZE/BANDAGES/DRESSINGS) ×3 IMPLANT
MARKER SKIN DUAL TIP RULER LAB (MISCELLANEOUS) ×3 IMPLANT
NEEDLE HYPO 25X1 1.5 SAFETY (NEEDLE) ×3 IMPLANT
NS IRRIG 1000ML POUR BTL (IV SOLUTION) ×3 IMPLANT
PACK BASIC VI WITH GOWN DISP (CUSTOM PROCEDURE TRAY) ×3 IMPLANT
SPONGE LAP 18X18 X RAY DECT (DISPOSABLE) ×3 IMPLANT
SUT MNCRL AB 4-0 PS2 18 (SUTURE) ×3 IMPLANT
SUT VIC AB 3-0 SH 18 (SUTURE) ×3 IMPLANT
SUT VIC AB 3-0 SH 27 (SUTURE)
SUT VIC AB 3-0 SH 27XBRD (SUTURE) IMPLANT
SUT VICRYL 0 UR6 27IN ABS (SUTURE) IMPLANT
SYR CONTROL 10ML LL (SYRINGE) ×3 IMPLANT
TAPE CLOTH SURG 4X10 WHT LF (GAUZE/BANDAGES/DRESSINGS) ×3 IMPLANT
TOWEL OR 17X26 10 PK STRL BLUE (TOWEL DISPOSABLE) ×3 IMPLANT
YANKAUER SUCT BULB TIP 10FT TU (MISCELLANEOUS) ×3 IMPLANT

## 2015-03-28 NOTE — Anesthesia Procedure Notes (Addendum)
Procedure Name: Intubation Date/Time: 03/28/2015 11:54 AM Performed by: Dione Booze Pre-anesthesia Checklist: Patient identified, Emergency Drugs available, Suction available and Patient being monitored Patient Re-evaluated:Patient Re-evaluated prior to inductionOxygen Delivery Method: Circle system utilized Preoxygenation: Pre-oxygenation with 100% oxygen Intubation Type: IV induction Laryngoscope Size: Mac and 4 Grade View: Grade II Tube type: Oral Tube size: 7.5 mm Number of attempts: 1 Airway Equipment and Method: Stylet Placement Confirmation: ETT inserted through vocal cords under direct vision and positive ETCO2 Secured at: 22 cm Tube secured with: Tape Dental Injury: Teeth and Oropharynx as per pre-operative assessment

## 2015-03-28 NOTE — Discharge Instructions (Addendum)
Lipoma A lipoma is a noncancerous (benign) tumor that is made up of fat cells. This is a very common type of soft-tissue growth. Lipomas are usually found under the skin (subcutaneous). They may occur in any tissue of the body that contains fat. Common areas for lipomas to appear include the back, shoulders, buttocks, and thighs. Lipomas grow slowly, and they are usually painless. Most lipomas do not cause problems and do not require treatment. CAUSES The cause of this condition is not known. RISK FACTORS This condition is more likely to develop in:  People who are 59-51 years old.  People who have a family history of lipomas. SYMPTOMS A lipoma usually appears as a small, round bump under the skin. It may feel soft or rubbery, but the firmness can vary. Most lipomas are not painful. However, a lipoma may become painful if it is located in an area where it pushes on nerves. DIAGNOSIS A lipoma can usually be diagnosed with a physical exam. You may also have tests to confirm the diagnosis and to rule out other conditions. Tests may include:  Imaging tests, such as a CT scan or MRI.  Removal of a tissue sample to be looked at under a microscope (biopsy). TREATMENT Treatment is not needed for small lipomas that are not causing problems. If a lipoma continues to get bigger or it causes problems, removal is often the best option. Lipomas can also be removed to improve appearance. Removal of a lipoma is usually done with a surgery in which the fatty cells and the surrounding capsule are removed. Most often, a medicine that numbs the area (local anesthetic) is used for this procedure. HOME CARE INSTRUCTIONS  Keep all follow-up visits as directed by your health care provider. This is important. SEEK MEDICAL CARE IF:  Your lipoma becomes larger or hard.  Your lipoma becomes painful, red, or increasingly swollen. These could be signs of infection or a more serious condition.   This information is  not intended to replace advice given to you by your health care provider. Make sure you discuss any questions you have with your health care provider.   Document Released: 02/14/2002 Document Revised: 07/11/2014 Document Reviewed: 02/20/2014 Elsevier Interactive Patient Education 2016 [place] may remove the gauze and tape on 03/29/2015 afternoon.  There is a dermabond (skin glue) dressing over the incision. The skin glue will wear off in 10-14 days. Do not scratch it.    General Anesthesia, Adult, Care After Refer to this sheet in the next few weeks. These instructions provide you with information on caring for yourself after your procedure. Your health care provider may also give you more specific instructions. Your treatment has been planned according to current medical practices, but problems sometimes occur. Call your health care provider if you have any problems or questions after your procedure. WHAT TO EXPECT AFTER THE PROCEDURE After the procedure, it is typical to experience:  Sleepiness.  Nausea and vomiting. HOME CARE INSTRUCTIONS  For the first 24 hours after general anesthesia:  Have a responsible person with you.  Do not drive a car. If you are alone, do not take public transportation.  Do not drink alcohol.  Do not take medicine that has not been prescribed by your health care provider.  Do not sign important papers or make important decisions.  You may resume a normal diet and activities as directed by your health care provider.  Change bandages (dressings) as directed.  If you have  questions or problems that seem related to general anesthesia, call the hospital and ask for the anesthetist or anesthesiologist on call. SEEK MEDICAL CARE IF:  You have nausea and vomiting that continue the day after anesthesia.  You develop a rash. SEEK IMMEDIATE MEDICAL CARE IF:   You have difficulty breathing.  You have chest pain.  You have any allergic  problems.   This information is not intended to replace advice given to you by your health care provider. Make sure you discuss any questions you have with your health care provider.   Document Released: 06/02/2000 Document Revised: 03/17/2014 Document Reviewed: 06/25/2011 Elsevier Interactive Patient Education [employer].

## 2015-03-28 NOTE — Transfer of Care (Signed)
Immediate Anesthesia Transfer of Care Note  Patient: Barry Welch  Procedure(s) Performed: Procedure(s): EXCISION NECK MASS ([name])  Patient Location: PACU  Anesthesia Type:General  Level of Consciousness: awake, alert  and patient cooperative  Airway & Oxygen Therapy: Patient Spontanous Breathing and Patient connected to face mask oxygen  Post-op Assessment: Report given to RN and Post -op Vital signs reviewed and stable  Post vital signs: Reviewed and stable  Last Vitals:  Filed Vitals:   03/28/15 0932  BP: 131/77  Pulse: 79  Temp: 36.4 C  Resp: 16    Complications: No apparent anesthesia complications

## 2015-03-28 NOTE — H&P (Addendum)
Expand All Collapse All   History of Present Illness Ralene Ok MD; 02/13/2015 1:27 PM) Patient words: New-soft tissue mass.  The patient is a 75 year old male who presents with a complaint of Mass. Patient is a 75 year old male who is referred by Donnetta Hutching, FNP for evaluation of a posterior neck mass. Patient states these have this there for multiple years, 10+. He states that he does have some pain. He does state that at skin bigger. Patient underwent ultrasound which revealed a fatty mass.   Other Problems Malachi Bonds, CMA; 02/13/2015 1:19 PM) Arthritis Diabetes Mellitus High blood pressure Hypercholesterolemia  Diagnostic Studies History Malachi Bonds, CMA; 02/13/2015 1:19 PM) Colonoscopy never  Allergies Malachi Bonds, CMA; 02/13/2015 1:19 PM) No Known Drug Allergies12/08/2014  Medication History (Chemira Jones, CMA; 02/13/2015 1:22 PM) Atorvastatin Calcium (40MG  Tablet, Oral) Active. Brimonidine Tartrate (0.15% Solution, Ophthalmic) Active. Diclofenac Sodium (50MG  Tablet DR, Oral) Active. Fenofibrate (145MG  Tablet, Oral) Active. Fluticasone Propionate (50MCG/ACT Suspension, Nasal) Active. NovoLOG Mix 70/30 FlexPen ((70-30) 100UNIT/ML Susp Pen-inj, Subcutaneous) Active. Tradjenta (5MG  Tablet, Oral) Active. Aspirin (81MG  Tablet, Oral) Active. Benazepril HCl (10MG  Tablet, Oral) Active. Vitamin D (1000UNIT Capsule, Oral) Active. Clobetasol Propionate (0.05% Ointment, External) Active. Lumigan (0.01% Solution, Ophthalmic) Active. Pazeo (0.7% Solution, Ophthalmic) Active. Systane (0.4-0.3% Solution, Ophthalmic) Active. Medications Reconciled  Social History Malachi Bonds, CMA; 02/13/2015 1:19 PM) Alcohol use Remotely quit alcohol use. Caffeine use Carbonated beverages, Coffee. Tobacco use Former smoker.  Family History Malachi Bonds, CMA; 02/13/2015 1:19 PM) Arthritis Brother. Breast Cancer Sister. Diabetes Mellitus  Brother.    Review of Systems Malachi Bonds CMA; 02/13/2015 1:19 PM) General Not Present- Appetite Loss, Chills, Fatigue, Fever, Night Sweats, Weight Gain and Weight Loss. Skin Not Present- Change in Wart/Mole, Dryness, Hives, Jaundice, New Lesions, Non-Healing Wounds, Rash and Ulcer. HEENT Not Present- Earache, Hearing Loss, Hoarseness, Nose Bleed, Oral Ulcers, Ringing in the Ears, Seasonal Allergies, Sinus Pain, Sore Throat, Visual Disturbances, Wears glasses/contact lenses and Yellow Eyes. Respiratory Not Present- Bloody sputum, Chronic Cough, Difficulty Breathing, Snoring and Wheezing. Breast Not Present- Breast Mass, Breast Pain, Nipple Discharge and Skin Changes. Cardiovascular Not Present- Chest Pain, Difficulty Breathing Lying Down, Leg Cramps, Palpitations, Rapid Heart Rate, Shortness of Breath and Swelling of Extremities. Gastrointestinal Not Present- Abdominal Pain, Bloating, Bloody Stool, Change in Bowel Habits, Chronic diarrhea, Constipation, Difficulty Swallowing, Excessive gas, Gets full quickly at meals, Hemorrhoids, Indigestion, Nausea, Rectal Pain and Vomiting. Male Genitourinary Not Present- Blood in Urine, Change in Urinary Stream, Frequency, Impotence, Nocturia, Painful Urination, Urgency and Urine Leakage. Musculoskeletal Not Present- Back Pain, Joint Pain, Joint Stiffness, Muscle Pain, Muscle Weakness and Swelling of Extremities. Neurological Not Present- Decreased Memory, Fainting, Headaches, Numbness, Seizures, Tingling, Tremor, Trouble walking and Weakness. Psychiatric Not Present- Anxiety, Bipolar, Change in Sleep Pattern, Depression, Fearful and Frequent crying. Endocrine Not Present- Cold Intolerance, Excessive Hunger, Hair Changes, Heat Intolerance, Hot flashes and New Diabetes. Hematology Not Present- Easy Bruising, Excessive bleeding, Gland problems, HIV and Persistent Infections.  BP 131/77 mmHg  Pulse 79  Temp(Src) 97.6 F (36.4 C) (Oral)  Resp 16  Ht 6\' 1"   (1.854 m)  Wt 87.544 kg (193 lb)  BMI 25.47 kg/m2  SpO2 99%     Physical Exam Ralene Ok MD; 02/13/2015 1:27 PM) General Mental Status-Alert. General Appearance-Consistent with stated age. Hydration-Well hydrated. Voice-Normal.  Head and Neck Note: Approximately 5 x 4 cm subcutaneous mass, mobile, soft   Chest and Lung Exam Chest and lung exam reveals -quiet, even and easy respiratory  effort with no use of accessory muscles and on auscultation, normal breath sounds, no adventitious sounds and normal vocal resonance. Inspection Chest Wall - Normal. Back - normal.  Cardiovascular Cardiovascular examination reveals -normal heart sounds, regular rate and rhythm with no murmurs and normal pedal pulses bilaterally.  Abdomen Inspection Inspection of the abdomen reveals - No Hernias. Skin - Scar - no surgical scars. Palpation/Percussion Palpation and Percussion of the abdomen reveal - Soft, Non Tender, No Rebound tenderness, No Rigidity (guarding) and No hepatosplenomegaly. Auscultation Auscultation of the abdomen reveals - Bowel sounds normal.    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  1. The patient like to proceed to the operating room for excision of neck Mass. 2. Discussed with him the risks and benefits of the procedure to include but not limited to: Infection, bleeding, damage to structures structures, possible recurrence. The patient was understanding and wishes to proceed.

## 2015-03-28 NOTE — Op Note (Signed)
03/28/2015  12:37 PM  PATIENT:  Barry Welch  75 y.o. male  PRE-OPERATIVE DIAGNOSIS:  4X5CM NECK MASS  POST-OPERATIVE DIAGNOSIS:  8.5X8.5CM FATTY NECK MASS  PROCEDURE:  Procedure(s): EXCISION NECK MASS ([name])  SURGEON:  Surgeon(s) and Role:    * Ralene Ok, MD - Primary  ANESTHESIA:   local and general  EBL:  Total I/O In: -  Out: 50 [Blood:15]  BLOOD ADMINISTERED:none  DRAINS: none   LOCAL MEDICATIONS USED:  BUPIVICAINE   SPECIMEN:  Source of Specimen:  Posterior neck mass, 8.5 x 8.5 cm SQ  DISPOSITION OF SPECIMEN:  PATHOLOGY  COUNTS:  YES  TOURNIQUET:  * No tourniquets in log *  DICTATION: .Dragon Dictation After the patient was consented he was taken back to the operating room and placed in the supine position with bilateral wheezing place. He underwent general endotracheal anesthesia. He was then placed in the prone position. SCDs were in place. A timeout was called all facts were verified. Patient prepped and draped in the usual sterile fashion.  A 5 cm incision was made paraspinally. Electrocautery was used to maintain hemostasis and dissection was taken down to the fatty mass. This was circumferentially dissected away from straight subcutaneous tissue. Blunt and electrocautery was used to dissect the mass away from the surrounding subcutaneous and muscular tissue. This was circumferentially done. Once this was delivered up to the wound. This was measured. This measured to be approximately 8.5 x 8.5 cm.  The wound was irrigated out with sterile saline. Hemostasis was achieved with electrocautery.  At this time 3-0 Vicryl's were used to reapproximate deep dermal layer. Quarter percent Marcaine was used to infiltrate the subcutaneous and dermal layers. The fascial layer was incorporated to help limit the space. 4 Monocryl was used to reapproximate the epidermal layer.  The skin was dressed with LiquiBand.  A pressure dressing was placed with 4 x 4's and tape. The  patient tied the procedure well. He states the recovery room stable condition.  PLAN OF CARE: Discharge to home after PACU  PATIENT DISPOSITION:  PACU - hemodynamically stable.   Delay start of Pharmacological VTE agent (>24hrs) due to surgical blood loss or risk of bleeding: not applicable

## 2015-03-28 NOTE — Progress Notes (Signed)
Fasting blood sugar of 224 called to Dr Jacinto Reap. Jillyn Hidden. No new orders given

## 2015-03-28 NOTE — Anesthesia Postprocedure Evaluation (Signed)
Anesthesia Post Note  Patient: Barry Welch  Procedure(s) Performed: Procedure(s) (LRB): EXCISION NECK MASS ([name])  Patient location during evaluation: PACU Anesthesia Type: General Level of consciousness: awake and alert Pain management: pain level controlled Vital Signs Assessment: post-procedure vital signs reviewed and stable Respiratory status: spontaneous breathing, nonlabored ventilation, respiratory function stable and patient connected to nasal cannula oxygen Cardiovascular status: blood pressure returned to baseline and stable Postop Assessment: no signs of nausea or vomiting Anesthetic complications: no    Last Vitals:  Filed Vitals:   03/28/15 1300 03/28/15 1315  BP: 114/68 113/73  Pulse: 72 68  Temp:    Resp: 18 18    Last Pain:  Filed Vitals:   03/28/15 1318  PainSc: 0-No pain                 Zenaida Deed

## 2015-03-28 NOTE — Anesthesia Preprocedure Evaluation (Signed)
Anesthesia Evaluation  Patient identified by MRN, date of birth, ID band Patient awake    Reviewed: Allergy & Precautions, H&P , NPO status , Patient's Chart, lab work & pertinent test results  History of Anesthesia Complications Negative for: history of anesthetic complications  Airway Mallampati: II  TM Distance: >3 FB Neck ROM: full    Dental no notable dental hx.    Pulmonary former smoker,    Pulmonary exam normal breath sounds clear to auscultation       Cardiovascular hypertension, Normal cardiovascular exam Rhythm:regular Rate:Normal     Neuro/Psych  Headaches,    GI/Hepatic negative GI ROS, Neg liver ROS,   Endo/Other  diabetes, Type 2  Renal/GU CRFRenal disease     Musculoskeletal  (+) Arthritis ,   Abdominal   Peds  Hematology negative hematology ROS (+)   Anesthesia Other Findings   Reproductive/Obstetrics negative OB ROS                             Anesthesia Physical Anesthesia Plan  ASA: III  Anesthesia Plan: General   Post-op Pain Management:    Induction: Intravenous  Airway Management Planned: Oral ETT  Additional Equipment:   Intra-op Plan:   Post-operative Plan: Extubation in OR  Informed Consent: I have reviewed the patients History and Physical, chart, labs and discussed the procedure including the risks, benefits and alternatives for the proposed anesthesia with the patient or authorized representative who has indicated his/her understanding and acceptance.   Dental Advisory Given  Plan Discussed with: Anesthesiologist, CRNA and Surgeon  Anesthesia Plan Comments:         Anesthesia Quick Evaluation

## 2015-04-05 DIAGNOSIS — H04123 Dry eye syndrome of bilateral lacrimal glands: Secondary | ICD-10-CM | POA: Diagnosis not present

## 2015-04-05 DIAGNOSIS — H401133 Primary open-angle glaucoma, bilateral, severe stage: Secondary | ICD-10-CM | POA: Diagnosis not present

## 2015-04-10 ENCOUNTER — Other Ambulatory Visit: Payer: Self-pay | Admitting: Family

## 2015-04-11 ENCOUNTER — Ambulatory Visit: Payer: Commercial Managed Care - HMO | Admitting: Family

## 2015-04-12 ENCOUNTER — Ambulatory Visit (INDEPENDENT_AMBULATORY_CARE_PROVIDER_SITE_OTHER): Payer: Commercial Managed Care - HMO | Admitting: Family

## 2015-04-12 ENCOUNTER — Encounter: Payer: Self-pay | Admitting: Family

## 2015-04-12 VITALS — BP 128/70 | HR 83 | Temp 98.0°F | Ht 73.0 in | Wt 191.0 lb

## 2015-04-12 DIAGNOSIS — E1122 Type 2 diabetes mellitus with diabetic chronic kidney disease: Secondary | ICD-10-CM | POA: Diagnosis not present

## 2015-04-12 DIAGNOSIS — N183 Chronic kidney disease, stage 3 unspecified: Secondary | ICD-10-CM

## 2015-04-12 DIAGNOSIS — N184 Chronic kidney disease, stage 4 (severe): Secondary | ICD-10-CM

## 2015-04-12 DIAGNOSIS — I10 Essential (primary) hypertension: Secondary | ICD-10-CM

## 2015-04-12 DIAGNOSIS — E559 Vitamin D deficiency, unspecified: Secondary | ICD-10-CM | POA: Diagnosis not present

## 2015-04-12 DIAGNOSIS — E785 Hyperlipidemia, unspecified: Secondary | ICD-10-CM | POA: Diagnosis not present

## 2015-04-12 DIAGNOSIS — Z794 Long term (current) use of insulin: Secondary | ICD-10-CM

## 2015-04-12 DIAGNOSIS — M179 Osteoarthritis of knee, unspecified: Secondary | ICD-10-CM | POA: Diagnosis not present

## 2015-04-12 DIAGNOSIS — M1711 Unilateral primary osteoarthritis, right knee: Secondary | ICD-10-CM

## 2015-04-12 LAB — POCT GLYCOSYLATED HEMOGLOBIN (HGB A1C): HEMOGLOBIN A1C: 9.2

## 2015-04-12 NOTE — Patient Instructions (Signed)
Type 2 Diabetes Mellitus, Adult Type 2 diabetes mellitus, often simply referred to as type 2 diabetes, is a long-lasting (chronic) disease. In type 2 diabetes, the pancreas does not make enough insulin (a hormone), the cells are less responsive to the insulin that is made (insulin resistance), or both. Normally, insulin moves sugars from food into the tissue cells. The tissue cells use the sugars for energy. The lack of insulin or the lack of normal response to insulin causes excess sugars to build up in the blood instead of going into the tissue cells. As a result, high blood sugar (hyperglycemia) develops. The effect of high sugar (glucose) levels can cause many complications. Type 2 diabetes was also previously called adult-onset diabetes, but it can occur at any age.  RISK FACTORS  A person is predisposed to developing type 2 diabetes if someone in the family has the disease and also has one or more of the following primary risk factors:  Weight gain, or being overweight or obese.  An inactive lifestyle.  A history of consistently eating high-calorie foods. Maintaining a normal weight and regular physical activity can reduce the chance of developing type 2 diabetes. SYMPTOMS  A person with type 2 diabetes may not show symptoms initially. The symptoms of type 2 diabetes appear slowly. The symptoms include:  Increased thirst (polydipsia).  Increased urination (polyuria).  Increased urination during the night (nocturia).  Sudden or unexplained weight changes.  Frequent, recurring infections.  Tiredness (fatigue).  Weakness.  Vision changes, such as blurred vision.  Fruity smell to your breath.  Abdominal pain.  Nausea or vomiting.  Cuts or bruises which are slow to heal.  Tingling or numbness in the hands or feet.  An open skin wound (ulcer). DIAGNOSIS Type 2 diabetes is frequently not diagnosed until complications of diabetes are present. Type 2 diabetes is diagnosed  when symptoms or complications are present and when blood glucose levels are increased. Your blood glucose level may be checked by one or more of the following blood tests:  A fasting blood glucose test. You will not be allowed to eat for at least 8 hours before a blood sample is taken.  A random blood glucose test. Your blood glucose is checked at any time of the day regardless of when you ate.  A hemoglobin A1c blood glucose test. A hemoglobin A1c test provides information about blood glucose control over the previous 3 months.  An oral glucose tolerance test (OGTT). Your blood glucose is measured after you have not eaten (fasted) for 2 hours and then after you drink a glucose-containing beverage. TREATMENT   You may need to take insulin or diabetes medicine daily to keep blood glucose levels in the desired range.  If you use insulin, you may need to adjust the dosage depending on the carbohydrates that you eat with each meal or snack.  Lifestyle changes are recommended as part of your treatment. These may include:  Following an individualized diet plan developed by a nutritionist or dietitian.  Exercising daily. Your health care providers will set individualized treatment goals for you based on your age, your medicines, how long you have had diabetes, and any other medical conditions you have. Generally, the goal of treatment is to maintain the following blood glucose levels:  Before meals (preprandial): 80-130 mg/dL.  After meals (postprandial): below 180 mg/dL.  A1c: less than 6.5-7%. HOME CARE INSTRUCTIONS   Have your hemoglobin A1c level checked twice a year.  Perform daily blood glucose monitoring  as directed by your health care provider.  Monitor urine ketones when you are ill and as directed by your health care provider.  Take your diabetes medicine or insulin as directed by your health care provider to maintain your blood glucose levels in the desired range.  Never run  out of diabetes medicine or insulin. It is needed every day.  If you are using insulin, you may need to adjust the amount of insulin given based on your intake of carbohydrates. Carbohydrates can raise blood glucose levels but need to be included in your diet. Carbohydrates provide vitamins, minerals, and fiber which are an essential part of a healthy diet. Carbohydrates are found in fruits, vegetables, whole grains, dairy products, legumes, and foods containing added sugars.  Eat healthy foods. You should make an appointment to see a registered dietitian to help you create an eating plan that is right for you.  Lose weight if you are overweight.  Carry a medical alert card or wear your medical alert jewelry.  Carry a 15-gram carbohydrate snack with you at all times to treat low blood glucose (hypoglycemia). Some examples of 15-gram carbohydrate snacks include:  Glucose tablets, 3 or 4.  Glucose gel, 15-gram tube.  Raisins, 2 tablespoons (24 grams).  Jelly beans, 6.  Animal crackers, 8.  Regular pop, 4 ounces (120 mL).  Gummy treats, 9.  Recognize hypoglycemia. Hypoglycemia occurs with blood glucose levels of 70 mg/dL and below. The risk for hypoglycemia increases when fasting or skipping meals, during or after intense exercise, and during sleep. Hypoglycemia symptoms can include:  Tremors or shakes.  Decreased ability to concentrate.  Sweating.  Increased heart rate.  Headache.  Dry mouth.  Hunger.  Irritability.  Anxiety.  Restless sleep.  Altered speech or coordination.  Confusion.  Treat hypoglycemia promptly. If you are alert and able to safely swallow, follow the 15:15 rule:  Take 15-20 grams of rapid-acting glucose or carbohydrate. Rapid-acting options include glucose gel, glucose tablets, or 4 ounces (120 mL) of fruit juice, regular soda, or low-fat milk.  Check your blood glucose level 15 minutes after taking the glucose.  Take 15-20 grams more of  glucose if the repeat blood glucose level is still 70 mg/dL or below.  Eat a meal or snack within 1 hour once blood glucose levels return to normal.  Be alert to feeling very thirsty and urinating more frequently than usual, which are early signs of hyperglycemia. An early awareness of hyperglycemia allows for prompt treatment. Treat hyperglycemia as directed by your health care provider.  Engage in at least 150 minutes of moderate-intensity physical activity a week, spread over at least 3 days of the week or as directed by your health care provider. In addition, you should engage in resistance exercise at least 2 times a week or as directed by your health care provider. Try to spend no more than 90 minutes at one time inactive.  Adjust your medicine and food intake as needed if you start a new exercise or sport.  Follow your sick-day plan anytime you are unable to eat or drink as usual.  Do not use any tobacco products including cigarettes, chewing tobacco, or electronic cigarettes. If you need help quitting, ask your health care provider.  Limit alcohol intake to no more than 1 drink per day for nonpregnant women and 2 drinks per day for men. You should drink alcohol only when you are also eating food. Talk with your health care provider whether alcohol is safe   for you. Tell your health care provider if you drink alcohol several times a week.  Keep all follow-up visits as directed by your health care provider. This is important.  Schedule an eye exam soon after the diagnosis of type 2 diabetes and then annually.  Perform daily skin and foot care. Examine your skin and feet daily for cuts, bruises, redness, nail problems, bleeding, blisters, or sores. A foot exam by a health care provider should be done annually.  Brush your teeth and gums at least twice a day and floss at least once a day. Follow up with your dentist regularly.  Share your diabetes management plan with your workplace or  school.  Keep your immunizations up to date. It is recommended that you receive a flu (influenza) vaccine every year. It is also recommended that you receive a pneumonia (pneumococcal) vaccine. If you are 64 years of age or older and have never received a pneumonia vaccine, this vaccine may be given as a series of two separate shots. Ask your health care provider which additional vaccines may be recommended.  Learn to manage stress.  Obtain ongoing diabetes education and support as needed.  Participate in or seek rehabilitation as needed to maintain or improve independence and quality of life. Request a physical or occupational therapy referral if you are having foot or hand numbness, or difficulties with grooming, dressing, eating, or physical activity. SEEK MEDICAL CARE IF:   You are unable to eat food or drink fluids for more than 6 hours.  You have nausea and vomiting for more than 6 hours.  Your blood glucose level is over 240 mg/dL.  There is a change in mental status.  You develop an additional serious illness.  You have diarrhea for more than 6 hours.  You have been sick or have had a fever for a couple of days and are not getting better.  You have pain during any physical activity.  SEEK IMMEDIATE MEDICAL CARE IF:  You have difficulty breathing.  You have moderate to large ketone levels.   This information is not intended to replace advice given to you by your health care provider. Make sure you discuss any questions you have with your health care provider.   Document Released: 02/24/2005 Document Revised: 11/15/2014 Document Reviewed: 09/23/2011 Elsevier Interactive Patient Education 2016 [employer]. Diabetes Mellitus and Food It is important for you to manage your blood sugar (glucose) level. Your blood glucose level can be greatly affected by what you eat. Eating healthier foods in the appropriate amounts throughout the day at about the same time each day will  help you control your blood glucose level. It can also help slow or prevent worsening of your diabetes mellitus. Healthy eating may even help you improve the level of your blood pressure and reach or maintain a healthy weight.  General recommendations for healthful eating and cooking habits include:  Eating meals and snacks regularly. Avoid going long periods of time without eating to lose weight.  Eating a diet that consists mainly of plant-based foods, such as fruits, vegetables, nuts, legumes, and whole grains.  Using low-heat cooking methods, such as baking, instead of high-heat cooking methods, such as deep frying. Work with your dietitian to make sure you understand how to use the Nutrition Facts information on food labels. HOW CAN FOOD AFFECT ME? Carbohydrates Carbohydrates affect your blood glucose level more than any other type of food. Your dietitian will help you determine how many carbohydrates to eat at  each meal and teach you how to count carbohydrates. Counting carbohydrates is important to keep your blood glucose at a healthy level, especially if you are using insulin or taking certain medicines for diabetes mellitus. Alcohol Alcohol can cause sudden decreases in blood glucose (hypoglycemia), especially if you use insulin or take certain medicines for diabetes mellitus. Hypoglycemia can be a life-threatening condition. Symptoms of hypoglycemia (sleepiness, dizziness, and disorientation) are similar to symptoms of having too much alcohol.  If your health care provider has given you approval to drink alcohol, do so in moderation and use the following guidelines:  Women should not have more than one drink per day, and men should not have more than two drinks per day. One drink is equal to:  12 oz of beer.  5 oz of wine.  1 oz of hard liquor.  Do not drink on an empty stomach.  Keep yourself hydrated. Have water, diet soda, or unsweetened iced tea.  Regular soda, juice, and  other mixers might contain a lot of carbohydrates and should be counted. WHAT FOODS ARE NOT RECOMMENDED? As you make food choices, it is important to remember that all foods are not the same. Some foods have fewer nutrients per serving than other foods, even though they might have the same number of calories or carbohydrates. It is difficult to get your body what it needs when you eat foods with fewer nutrients. Examples of foods that you should avoid that are high in calories and carbohydrates but low in nutrients include:  Trans fats (most processed foods list trans fats on the Nutrition Facts label).  Regular soda.  Juice.  Candy.  Sweets, such as cake, pie, doughnuts, and cookies.  Fried foods. WHAT FOODS CAN I EAT? Eat nutrient-rich foods, which will nourish your body and keep you healthy. The food you should eat also will depend on several factors, including:  The calories you need.  The medicines you take.  Your weight.  Your blood glucose level.  Your blood pressure level.  Your cholesterol level. You should eat a variety of foods, including:  Protein.  Lean cuts of meat.  Proteins low in saturated fats, such as fish, egg whites, and beans. Avoid processed meats.  Fruits and vegetables.  Fruits and vegetables that may help control blood glucose levels, such as apples, mangoes, and yams.  Dairy products.  Choose fat-free or low-fat dairy products, such as milk, yogurt, and cheese.  Grains, bread, pasta, and rice.  Choose whole grain products, such as multigrain bread, whole oats, and brown rice. These foods may help control blood pressure.  Fats.  Foods containing healthful fats, such as nuts, avocado, olive oil, canola oil, and fish. DOES EVERYONE WITH DIABETES MELLITUS HAVE THE SAME MEAL PLAN? Because every person with diabetes mellitus is different, there is not one meal plan that works for everyone. It is very important that you meet with a dietitian  who will help you create a meal plan that is just right for you.   This information is not intended to replace advice given to you by your health care provider. Make sure you discuss any questions you have with your health care provider.   Document Released: 11/21/2004 Document Revised: 03/17/2014 Document Reviewed: 01/21/2013 Elsevier Interactive Patient Education [employer].

## 2015-04-12 NOTE — Progress Notes (Signed)
Subjective:    Patient ID: Barry Welch, male    DOB: 03-22-1940, 75 y.o.   MRN: [id number]  Pt presents to the office today for chronic follow up.  Diabetes He presents for his follow-up diabetic visit. He has type 2 diabetes mellitus. His disease course has been stable. Pertinent negatives for hypoglycemia include no confusion, dizziness or mood changes. Pertinent negatives for diabetes include no foot paresthesias, no foot ulcerations, no polyuria, no visual change and no weakness. Pertinent negatives for hypoglycemia complications include no blackouts and no hospitalization. Symptoms are stable. Diabetic complications include nephropathy. Pertinent negatives for diabetic complications include no CVA, heart disease, peripheral neuropathy or PVD. Risk factors for coronary artery disease include diabetes mellitus, dyslipidemia, hypertension and male sex. Current diabetic treatment includes insulin injections. He is compliant with treatment all of the time. He is following a diabetic diet. His breakfast blood glucose range is generally 130-140 mg/dl. An ACE inhibitor/angiotensin II receptor blocker is being taken. Eye exam is current (Jan 2017).  Hypertension This is a chronic problem. The current episode started more than 1 year ago. The problem has been resolved since onset. The problem is controlled. Pertinent negatives include no anxiety, palpitations, peripheral edema or shortness of breath. Risk factors for coronary artery disease include diabetes mellitus, dyslipidemia, family history and male gender. Past treatments include ACE inhibitors. The current treatment provides moderate improvement. Hypertensive end-organ damage includes kidney disease. There is no history of CVA, heart failure, PVD or a thyroid problem. There is no history of sleep apnea.  Hyperlipidemia This is a chronic problem. The current episode started more than 1 year ago. The problem is controlled. Recent lipid tests were  reviewed and are normal. Exacerbating diseases include diabetes. He has no history of hypothyroidism. Factors aggravating his hyperlipidemia include fatty foods. Pertinent negatives include no leg pain, myalgias or shortness of breath. Current antihyperlipidemic treatment includes statins and fibric acid derivatives. The current treatment provides moderate improvement of lipids. Risk factors for coronary artery disease include diabetes mellitus, dyslipidemia, hypertension and male sex.      Review of Systems  Constitutional: Negative.   HENT: Negative.   Respiratory: Negative.  Negative for shortness of breath.   Cardiovascular: Negative.  Negative for palpitations.  Gastrointestinal: Negative.   Endocrine: Negative.  Negative for polyuria.  Genitourinary: Negative.   Musculoskeletal: Negative.  Negative for myalgias.  Neurological: Negative.  Negative for dizziness and weakness.  Hematological: Negative.   Psychiatric/Behavioral: Negative.  Negative for confusion.  All other systems reviewed and are negative.      Objective:   Physical Exam  Constitutional: He is oriented to person, place, and time. He appears well-developed and well-nourished. No distress.  HENT:  Head: Normocephalic.  Right Ear: External ear normal.  Left Ear: External ear normal.  Nose: Nose normal.  Mouth/Throat: Oropharynx is clear and moist.      Eyes: Pupils are equal, round, and reactive to light. Right eye exhibits no discharge. Left eye exhibits no discharge.  Neck: Normal range of motion. Neck supple. No thyromegaly present.  Cardiovascular: Normal rate, regular rhythm, normal heart sounds and intact distal pulses.   No murmur heard. Pulmonary/Chest: Effort normal and breath sounds normal. No respiratory distress. He has no wheezes.  Abdominal: Soft. Bowel sounds are normal. He exhibits no distension. There is no tenderness.  Musculoskeletal: Normal range of motion. He exhibits no edema or  tenderness.  Neurological: He is alert and oriented to person, place, and time. He has  normal reflexes. No cranial nerve deficit.  Skin: Skin is warm and dry. No rash noted. No erythema.  Psychiatric: He has a normal mood and affect. His behavior is normal. Judgment and thought content normal.  Vitals reviewed.   BP 128/70 mmHg  Pulse 83  Temp(Src) 98 F (36.7 C) (Oral)  Ht 6' 1"  (1.854 m)  Wt 191 lb (86.637 kg)  BMI 25.20 kg/m2       Assessment & Plan:  1. Essential hypertension - CMP14+EGFR  2. Type 2 diabetes mellitus with stage 3 chronic kidney disease, with long-term current use of insulin (HCC) - POCT glycosylated hemoglobin (Hb A1C) - CMP14+EGFR - Microalbumin / creatinine urine ratio - Ambulatory referral to Endocrinology  3. Osteoarthritis of right knee, unspecified osteoarthritis type - CMP14+EGFR  4. Chronic kidney disease (CKD), stage IV (severe) (HCC)  - CMP14+EGFR  5. Vitamin D deficiency - CMP14+EGFR - VITAMIN D 25 Hydroxy (Vit-D Deficiency, Fractures)  6. Hyperlipidemia  - CMP14+EGFR - Lipid panel   Continue all meds Labs pending Health Maintenance reviewed Diet and exercise encouraged RTO 6 months, Keep appt with Endo  Evelina Dun, FNP

## 2015-04-13 ENCOUNTER — Other Ambulatory Visit: Payer: Self-pay | Admitting: Family

## 2015-04-13 LAB — CMP14+EGFR
ALK PHOS: 50 IU/L (ref 39–117)
ALT: 16 IU/L (ref 0–44)
AST: 20 IU/L (ref 0–40)
Albumin/Globulin Ratio: 1.3 (ref 1.1–2.5)
Albumin: 4.2 g/dL (ref 3.5–4.8)
BUN/Creatinine Ratio: 10 (ref 10–22)
BUN: 18 mg/dL (ref 8–27)
Bilirubin Total: 0.5 mg/dL (ref 0.0–1.2)
CO2: 23 mmol/L (ref 18–29)
CREATININE: 1.86 mg/dL — AB (ref 0.76–1.27)
Calcium: 9.6 mg/dL (ref 8.6–10.2)
Chloride: 104 mmol/L (ref 96–106)
GFR calc Af Amer: 40 mL/min/{1.73_m2} — ABNORMAL LOW (ref >59)
GFR calc non Af Amer: 34 mL/min/{1.73_m2} — ABNORMAL LOW (ref >59)
GLOBULIN, TOTAL: 3.2 g/dL (ref 1.5–4.5)
Glucose: 125 mg/dL — ABNORMAL HIGH (ref 65–99)
POTASSIUM: 4.8 mmol/L (ref 3.5–5.2)
SODIUM: 143 mmol/L (ref 134–144)
Total Protein: 7.4 g/dL (ref 6.0–8.5)

## 2015-04-13 LAB — MICROALBUMIN / CREATININE URINE RATIO
Creatinine, Urine: 136.1 mg/dL
MICROALB/CREAT RATIO: 2.9 mg/g{creat} (ref 0.0–30.0)
Microalbumin, Urine: 3.9 ug/mL

## 2015-04-13 LAB — LIPID PANEL
CHOLESTEROL TOTAL: 144 mg/dL (ref 100–199)
Chol/HDL Ratio: 4.4 ratio units (ref 0.0–5.0)
HDL: 33 mg/dL — AB (ref >39)
LDL CALC: 93 mg/dL (ref 0–99)
TRIGLYCERIDES: 92 mg/dL (ref 0–149)
VLDL Cholesterol Cal: 18 mg/dL (ref 5–40)

## 2015-04-13 LAB — VITAMIN D 25 HYDROXY (VIT D DEFICIENCY, FRACTURES): VIT D 25 HYDROXY: 28.2 ng/mL — AB (ref 30.0–100.0)

## 2015-05-10 ENCOUNTER — Telehealth: Payer: Self-pay | Admitting: *Deleted

## 2015-05-10 MED ORDER — ACCU-CHEK SOFT TOUCH LANCETS MISC: Status: AC

## 2015-05-10 MED ORDER — ACCU-CHEK NANO SMARTVIEW W/DEVICE KIT: PACK | Status: AC

## 2015-05-10 MED ORDER — GLUCOSE BLOOD VI STRP: ORAL_STRIP | Status: DC

## 2015-05-10 NOTE — Telephone Encounter (Signed)
done

## 2015-05-31 DIAGNOSIS — N183 Chronic kidney disease, stage 3 (moderate): Secondary | ICD-10-CM | POA: Diagnosis not present

## 2015-05-31 DIAGNOSIS — E785 Hyperlipidemia, unspecified: Secondary | ICD-10-CM | POA: Diagnosis not present

## 2015-05-31 DIAGNOSIS — E875 Hyperkalemia: Secondary | ICD-10-CM | POA: Diagnosis not present

## 2015-05-31 DIAGNOSIS — I129 Hypertensive chronic kidney disease with stage 1 through stage 4 chronic kidney disease, or unspecified chronic kidney disease: Secondary | ICD-10-CM | POA: Diagnosis not present

## 2015-05-31 DIAGNOSIS — E1129 Type 2 diabetes mellitus with other diabetic kidney complication: Secondary | ICD-10-CM | POA: Diagnosis not present

## 2015-06-02 ENCOUNTER — Other Ambulatory Visit: Payer: Self-pay | Admitting: "Endocrinology

## 2015-06-02 DIAGNOSIS — E1122 Type 2 diabetes mellitus with diabetic chronic kidney disease: Secondary | ICD-10-CM | POA: Diagnosis not present

## 2015-06-02 DIAGNOSIS — Z794 Long term (current) use of insulin: Secondary | ICD-10-CM | POA: Diagnosis not present

## 2015-06-02 DIAGNOSIS — N183 Chronic kidney disease, stage 3 (moderate): Secondary | ICD-10-CM | POA: Diagnosis not present

## 2015-06-02 LAB — BASIC METABOLIC PANEL
BUN: 25 mg/dL (ref 7–25)
CHLORIDE: 105 mmol/L (ref 98–110)
CO2: 26 mmol/L (ref 20–31)
Calcium: 9.4 mg/dL (ref 8.6–10.3)
Creat: 1.98 mg/dL — ABNORMAL HIGH (ref 0.70–1.18)
Glucose, Bld: 156 mg/dL — ABNORMAL HIGH (ref 65–99)
POTASSIUM: 5 mmol/L (ref 3.5–5.3)
SODIUM: 139 mmol/L (ref 135–146)

## 2015-06-02 LAB — TSH: TSH: 2.22 mIU/L (ref 0.40–4.50)

## 2015-06-02 LAB — T4, FREE: FREE T4: 1 ng/dL (ref 0.8–1.8)

## 2015-06-03 LAB — HEMOGLOBIN A1C
HEMOGLOBIN A1C: 9.3 % — AB (ref <5.7)
Mean Plasma Glucose: 220 mg/dL — ABNORMAL HIGH (ref <117)

## 2015-06-07 ENCOUNTER — Ambulatory Visit (INDEPENDENT_AMBULATORY_CARE_PROVIDER_SITE_OTHER): Payer: Commercial Managed Care - HMO | Admitting: "Endocrinology

## 2015-06-07 ENCOUNTER — Encounter: Payer: Self-pay | Admitting: "Endocrinology

## 2015-06-07 VITALS — BP 120/71 | HR 69 | Ht 73.0 in | Wt 192.0 lb

## 2015-06-07 DIAGNOSIS — E1122 Type 2 diabetes mellitus with diabetic chronic kidney disease: Secondary | ICD-10-CM

## 2015-06-07 DIAGNOSIS — E559 Vitamin D deficiency, unspecified: Secondary | ICD-10-CM

## 2015-06-07 DIAGNOSIS — Z794 Long term (current) use of insulin: Secondary | ICD-10-CM | POA: Diagnosis not present

## 2015-06-07 DIAGNOSIS — E785 Hyperlipidemia, unspecified: Secondary | ICD-10-CM | POA: Diagnosis not present

## 2015-06-07 DIAGNOSIS — I10 Essential (primary) hypertension: Secondary | ICD-10-CM

## 2015-06-07 DIAGNOSIS — N183 Chronic kidney disease, stage 3 (moderate): Secondary | ICD-10-CM

## 2015-06-07 NOTE — Patient Instructions (Signed)

## 2015-06-07 NOTE — Progress Notes (Signed)
Subjective:    Patient ID: Barry Welch, male    DOB: 1940-04-26,    Past Medical History  Diagnosis Date  . Hypertension   . Hyperlipidemia   . Diabetes mellitus   . Diverticulosis   . CKD (chronic kidney disease)   . Headache   . Arthritis   . Gout involving toe of right foot    Past Surgical History  Procedure Laterality Date  . Cataract extraction w/ intraocular lens  implant, bilateral  2012  . Knee surgery      ORIF LEFT KNEE   . Mass excision [name] 03/28/2015    Procedure: EXCISION NECK MASS;  Surgeon: Ralene Ok, MD;  Location: [facility];  Service: General;  Laterality: [name];   Social History   Social History  . Marital Status: Legally Separated    Spouse Name: [name]  . Number of Children: [name]  . Years of Education: [name]   Social History Main Topics  . Smoking status: Former Smoker    Quit date: 03/11/2007  . Smokeless tobacco: Never Used  . Alcohol Use: No  . Drug Use: No  . Sexual Activity: Not Asked   Other Topics Concern  . None   Social History Narrative   DISABLED X 12 YRS    SEPARATED X 12 YRS     5 CHILDREN         Outpatient Encounter Prescriptions as of 06/07/2015  Medication Sig  . acetaminophen (TYLENOL) 500 MG tablet Take 500 mg by mouth every 6 (six) hours as needed for mild pain or moderate pain.  Marland Kitchen aspirin EC 81 MG tablet Take 81 mg by mouth daily.  Marland Kitchen atorvastatin (LIPITOR) 40 MG tablet TAKE ONE TABLET BY MOUTH ONE TIME DAILY  . brimonidine (ALPHAGAN) 0.15 % ophthalmic solution Place 1 drop into both eyes 2 (two) times daily.  . cholecalciferol (VITAMIN D) 1000 UNITS tablet Take 1,000 Units by mouth every morning.   . fenofibrate (TRICOR) 145 MG tablet TAKE ONE TABLET BY MOUTH ONE TIME DAILY  . Insulin Aspart Prot & Aspart (NOVOLOG MIX 70/30 FLEXPEN) (70-30) 100 UNIT/ML SUPN Inject 25-40 Units into the skin 2 (two) times daily with a meal. Inject 40 units each morning before breakfast and 25 units each evening before supper. Hold  for CBG < 91  . olopatadine (PATANOL) 0.1 % ophthalmic solution Place 1 drop into both eyes 2 (two) times daily as needed for allergies.  . Omega-3 Fatty Acids (FISH OIL) 1000 MG CAPS Take 1 capsule by mouth 2 (two) times daily.  Vladimir Faster Glycol-Propyl Glycol (SYSTANE ULTRA OP) Apply 1 drop to eye as needed (Dry eyes).   . TRADJENTA 5 MG TABS tablet TAKE ONE TABLET BY MOUTH ONE TIME DAILY  . Blood Glucose Monitoring Suppl (ACCU-CHEK NANO SMARTVIEW) w/Device KIT Use device to test bid. Dx E11.22  . glucose blood (ACCU-CHEK SMARTVIEW) test strip Test bid. Dx E11.22  . Lancets (ACCU-CHEK SOFT TOUCH) lancets Use to test blood sugar bid. Dx E11.22  . NOVOFINE 30G X 8 MM MISC   . [DISCONTINUED] oxyCODONE-acetaminophen (ROXICET) 5-325 MG tablet Take 1-2 tablets by mouth every 4 (four) hours as needed.   No facility-administered encounter medications on file as of 06/07/2015.   ALLERGIES: No Known Allergies VACCINATION STATUS: Immunization History  Administered Date(s) Administered  . H1N1 03/21/2008  . Influenza Split 01/07/2011  . Influenza Whole 12/14/2008, 12/18/2009  . Influenza,inj,Quad PF,36+ Mos 12/24/2012, 12/21/2013, 01/08/2015  . Pneumococcal Conjugate-13 07/04/2014  .  Pneumococcal Polysaccharide-23 01/07/2011  . Tdap 09/06/2010  . Zoster 06/23/2013    Diabetes He presents for his follow-up diabetic visit. He has type 2 diabetes mellitus. Onset time: diagnosed at approx age 25. His disease course has been improving. Pertinent negatives for hypoglycemia include no confusion, headaches, pallor or seizures. Pertinent negatives for diabetes include no chest pain, no fatigue, no polydipsia, no polyphagia, no polyuria and no weakness. Symptoms are improving. Diabetic complications include a CVA, nephropathy and PVD. Risk factors for coronary artery disease include diabetes mellitus, dyslipidemia, male sex, tobacco exposure and sedentary lifestyle. He is compliant with treatment most of  the time. His weight is stable. He is following a generally unhealthy diet. Meal planning includes avoidance of concentrated sweets. He rarely participates in exercise. His home blood glucose trend is decreasing steadily. His breakfast blood glucose range is generally 140-180 mg/dl. His overall blood glucose range is 180-200 mg/dl. An ACE inhibitor/angiotensin II receptor blocker is being taken. Eye exam is current.  Hyperlipidemia This is a chronic problem. The current episode started more than 1 year ago. The problem is controlled. Pertinent negatives include no chest pain, myalgias or shortness of breath. Current antihyperlipidemic treatment includes statins. There are no compliance problems.   Hypertension This is a chronic problem. The current episode started more than 1 year ago. The problem is controlled. Pertinent negatives include no chest pain, headaches, neck pain, palpitations or shortness of breath. Past treatments include ACE inhibitors. Hypertensive end-organ damage includes CVA and PVD.     Review of Systems  Constitutional: Negative for fatigue and unexpected weight change.  HENT: Positive for dental problem. Negative for mouth sores and trouble swallowing.   Eyes: Negative for visual disturbance.  Respiratory: Negative for cough, choking, chest tightness, shortness of breath and wheezing.   Cardiovascular: Negative for chest pain, palpitations and leg swelling.  Gastrointestinal: Negative for nausea, vomiting, abdominal pain, diarrhea, constipation and abdominal distention.  Endocrine: Negative for polydipsia, polyphagia and polyuria.  Genitourinary: Negative for dysuria, urgency, hematuria and flank pain.  Musculoskeletal: Negative for myalgias, back pain, joint swelling, gait problem and neck pain.  Skin: Negative for pallor, rash and wound.  Neurological: Negative for seizures, syncope, weakness, numbness and headaches.  Psychiatric/Behavioral: Negative for suicidal ideas,  hallucinations, confusion and dysphoric mood.    Objective:    BP 120/71 mmHg  Pulse 69  Ht '6\' 1"'$  (1.854 m)  Wt 192 lb (87.091 kg)  BMI 25.34 kg/m2  SpO2 96%  Wt Readings from Last 3 Encounters:  06/07/15 192 lb (87.091 kg)  04/12/15 191 lb (86.637 kg)  03/28/15 193 lb (87.544 kg)    Physical Exam  Constitutional: He is oriented to person, place, and time. He appears well-developed and well-nourished. He is cooperative.  HENT:  Head: Normocephalic and atraumatic.  Mouth/Throat: Abnormal dentition.  Eyes: EOM are normal.  Neck: Normal range of motion. Neck supple. No tracheal deviation present. No thyromegaly present.  Cardiovascular: Normal rate and normal heart sounds.  Exam reveals no gallop.   No murmur heard. Pulses:      Carotid pulses are 2+ on the right side.      Radial pulses are 2+ on the right side.       Dorsalis pedis pulses are 1+ on the right side.       Posterior tibial pulses are 1+ on the right side.  Pulmonary/Chest: Breath sounds normal. No respiratory distress. He has no wheezes.  Abdominal: Soft. Bowel sounds are normal. He exhibits  no distension. There is no hepatosplenomegaly. There is no tenderness. There is no guarding and no CVA tenderness.  Musculoskeletal: He exhibits no edema.       Right shoulder: He exhibits no swelling and no deformity.  Lymphadenopathy:       Head (right side): No submental and no submandibular adenopathy present.       Head (left side): No submental and no submandibular adenopathy present.       Right cervical: No superficial cervical adenopathy present.      Left cervical: No superficial cervical adenopathy present.       Right: No supraclavicular adenopathy present.       Left: No supraclavicular adenopathy present.  Neurological: He is alert and oriented to person, place, and time. He has normal strength and normal reflexes. No cranial nerve deficit or sensory deficit. Gait normal.  Skin: Skin is warm and dry. No rash  noted. No cyanosis. Nails show no clubbing.  Psychiatric: He has a normal mood and affect. His speech is normal and behavior is normal. Judgment and thought content normal. Cognition and memory are normal.   Chemistry (most recent): Lab Results  Component Value Date   NA 139 06/02/2015   K 5.0 06/02/2015   CL 105 06/02/2015   CO2 26 06/02/2015   BUN 25 06/02/2015   CREATININE 1.98* 06/02/2015   Diabetic Labs (most recent): Lab Results  Component Value Date   HGBA1C 9.3* 06/02/2015   HGBA1C 9.2 04/12/2015   HGBA1C 9.6* 02/28/2015   Lipid Panel     Component Value Date/Time   CHOL 144 04/12/2015 0901   CHOL 121 04/22/2013 1603   CHOL 173 09/23/2012 1302   TRIG 92 04/12/2015 0901   TRIG 101 04/22/2013 1603   TRIG 177* 09/23/2012 1302   HDL 33* 04/12/2015 0901   HDL 27* 04/22/2013 1603   HDL 32* 09/23/2012 1302   HDL 34* 09/06/2010 1005   CHOLHDL 4.4 04/12/2015 0901   CHOLHDL 4.7 09/06/2010 1005   VLDL 34 09/06/2010 1005   LDLCALC 93 04/12/2015 0901   LDLCALC 74 04/22/2013 1603   LDLCALC 106* 09/23/2012 1302   LDLCALC 92 09/06/2010 1005     Assessment & Plan:   1. Type 2 diabetes mellitus with stage 3 chronic kidney disease His EAG is above target, a1c is at 9.3% , overall  improved from 13.8%.  - a1c may not be reliable in the face of advanced CKD.  His prior Fructosamine has changed to 330 from  a high of  529, correlates well with his high a1c.  His DM type 2 is complicated by CKD, and CVA. He remains at a high risk for more complications of type 2 DM.   He would need basal/bolus insulin, however due to his limited social support , I will continue with premixed insulin , Novolog 70 /30 , 40 units with breakfast and 25 units with supper.   - continue Tradjenta 5 mg po qday.  I have recounseled him on carbohydrates management. I advised him to avoid over-correction for BG readings around 100. continue low dose aspirin.  Pt will monitor BG BID and RTN in 12 wks  with labs, meter, logs for reevaluation and insulin adjustment.  2. Essential hypertension Controlled,  BP 120/71. Continue  ACEI for HTN.  3. Hyperlipidemia continue Statin for dyslipidemia   4. Vitamin D deficiency - I advised him to continue Vitamin D supplement.   Follow up plan: Return in about 3 months (around  09/07/2015) for diabetes, high blood pressure, high cholesterol, follow up with pre-visit labs, meter, and logs.  Glade Lloyd, MD Phone: [phone]  Fax: [phone]   06/07/2015, 2:32 PM

## 2015-06-13 ENCOUNTER — Other Ambulatory Visit: Payer: Self-pay | Admitting: "Endocrinology

## 2015-07-10 ENCOUNTER — Other Ambulatory Visit: Payer: Self-pay | Admitting: Family

## 2015-07-10 ENCOUNTER — Other Ambulatory Visit: Payer: Self-pay | Admitting: "Endocrinology

## 2015-08-24 DIAGNOSIS — E785 Hyperlipidemia, unspecified: Secondary | ICD-10-CM | POA: Diagnosis not present

## 2015-08-24 DIAGNOSIS — N08 Glomerular disorders in diseases classified elsewhere: Secondary | ICD-10-CM | POA: Diagnosis not present

## 2015-08-24 DIAGNOSIS — N189 Chronic kidney disease, unspecified: Secondary | ICD-10-CM | POA: Diagnosis not present

## 2015-08-24 DIAGNOSIS — E1165 Type 2 diabetes mellitus with hyperglycemia: Secondary | ICD-10-CM | POA: Diagnosis not present

## 2015-09-05 ENCOUNTER — Other Ambulatory Visit: Payer: Self-pay | Admitting: "Endocrinology

## 2015-09-07 ENCOUNTER — Ambulatory Visit: Payer: Commercial Managed Care - HMO | Admitting: "Endocrinology

## 2015-10-02 DIAGNOSIS — Z7984 Long term (current) use of oral hypoglycemic drugs: Secondary | ICD-10-CM | POA: Diagnosis not present

## 2015-10-02 DIAGNOSIS — E119 Type 2 diabetes mellitus without complications: Secondary | ICD-10-CM | POA: Diagnosis not present

## 2015-10-02 DIAGNOSIS — Z961 Presence of intraocular lens: Secondary | ICD-10-CM | POA: Diagnosis not present

## 2015-10-02 DIAGNOSIS — H401133 Primary open-angle glaucoma, bilateral, severe stage: Secondary | ICD-10-CM | POA: Diagnosis not present

## 2015-10-05 ENCOUNTER — Other Ambulatory Visit: Payer: Self-pay

## 2015-10-05 MED ORDER — LINAGLIPTIN 5 MG PO TABS: 5.0000 mg | ORAL_TABLET | Freq: Every day | ORAL | 0 refills | Status: AC

## 2015-10-05 MED ORDER — ATORVASTATIN CALCIUM 40 MG PO TABS: 40.0000 mg | ORAL_TABLET | Freq: Every day | ORAL | 0 refills | Status: DC

## 2015-10-05 MED ORDER — FENOFIBRATE 145 MG PO TABS: 145.0000 mg | ORAL_TABLET | Freq: Every day | ORAL | 0 refills | Status: DC

## 2015-10-10 ENCOUNTER — Ambulatory Visit: Payer: Commercial Managed Care - HMO | Admitting: Family

## 2015-11-06 ENCOUNTER — Other Ambulatory Visit: Payer: Self-pay | Admitting: "Endocrinology

## 2015-11-29 DIAGNOSIS — N08 Glomerular disorders in diseases classified elsewhere: Secondary | ICD-10-CM | POA: Diagnosis not present

## 2015-11-29 DIAGNOSIS — E785 Hyperlipidemia, unspecified: Secondary | ICD-10-CM | POA: Diagnosis not present

## 2015-11-29 DIAGNOSIS — E1122 Type 2 diabetes mellitus with diabetic chronic kidney disease: Secondary | ICD-10-CM | POA: Diagnosis not present

## 2015-11-29 DIAGNOSIS — N183 Chronic kidney disease, stage 3 (moderate): Secondary | ICD-10-CM | POA: Diagnosis not present

## 2015-11-29 DIAGNOSIS — N184 Chronic kidney disease, stage 4 (severe): Secondary | ICD-10-CM | POA: Diagnosis not present

## 2015-11-29 DIAGNOSIS — Z23 Encounter for immunization: Secondary | ICD-10-CM | POA: Diagnosis not present

## 2015-12-04 DIAGNOSIS — E785 Hyperlipidemia, unspecified: Secondary | ICD-10-CM | POA: Diagnosis not present

## 2015-12-04 DIAGNOSIS — I129 Hypertensive chronic kidney disease with stage 1 through stage 4 chronic kidney disease, or unspecified chronic kidney disease: Secondary | ICD-10-CM | POA: Diagnosis not present

## 2015-12-04 DIAGNOSIS — E1129 Type 2 diabetes mellitus with other diabetic kidney complication: Secondary | ICD-10-CM | POA: Diagnosis not present

## 2015-12-04 DIAGNOSIS — E875 Hyperkalemia: Secondary | ICD-10-CM | POA: Diagnosis not present

## 2015-12-04 DIAGNOSIS — M1710 Unilateral primary osteoarthritis, unspecified knee: Secondary | ICD-10-CM | POA: Diagnosis not present

## 2015-12-04 DIAGNOSIS — N183 Chronic kidney disease, stage 3 (moderate): Secondary | ICD-10-CM | POA: Diagnosis not present

## 2016-01-01 ENCOUNTER — Other Ambulatory Visit: Payer: Self-pay | Admitting: "Endocrinology

## 2016-01-01 ENCOUNTER — Other Ambulatory Visit: Payer: Self-pay | Admitting: Family

## 2016-01-04 ENCOUNTER — Other Ambulatory Visit: Payer: Self-pay | Admitting: "Endocrinology

## 2016-01-28 ENCOUNTER — Other Ambulatory Visit: Payer: Self-pay | Admitting: "Endocrinology

## 2016-02-17 ENCOUNTER — Other Ambulatory Visit: Payer: Self-pay | Admitting: Family

## 2016-02-20 ENCOUNTER — Other Ambulatory Visit: Payer: Self-pay | Admitting: "Endocrinology

## 2016-03-05 DIAGNOSIS — N183 Chronic kidney disease, stage 3 (moderate): Secondary | ICD-10-CM | POA: Diagnosis not present

## 2016-03-05 DIAGNOSIS — H6122 Impacted cerumen, left ear: Secondary | ICD-10-CM | POA: Diagnosis not present

## 2016-03-05 DIAGNOSIS — E1122 Type 2 diabetes mellitus with diabetic chronic kidney disease: Secondary | ICD-10-CM | POA: Diagnosis not present

## 2016-03-05 DIAGNOSIS — E785 Hyperlipidemia, unspecified: Secondary | ICD-10-CM | POA: Diagnosis not present

## 2016-03-05 DIAGNOSIS — N08 Glomerular disorders in diseases classified elsewhere: Secondary | ICD-10-CM | POA: Diagnosis not present

## 2016-03-05 DIAGNOSIS — Z0000 Encounter for general adult medical examination without abnormal findings: Secondary | ICD-10-CM | POA: Diagnosis not present

## 2016-03-20 DIAGNOSIS — Z961 Presence of intraocular lens: Secondary | ICD-10-CM | POA: Diagnosis not present

## 2016-03-20 DIAGNOSIS — H26493 Other secondary cataract, bilateral: Secondary | ICD-10-CM | POA: Diagnosis not present

## 2016-03-20 DIAGNOSIS — H401132 Primary open-angle glaucoma, bilateral, moderate stage: Secondary | ICD-10-CM | POA: Diagnosis not present

## 2016-04-02 ENCOUNTER — Encounter: Payer: Self-pay | Admitting: Podiatry

## 2016-04-02 ENCOUNTER — Ambulatory Visit (INDEPENDENT_AMBULATORY_CARE_PROVIDER_SITE_OTHER): Payer: Medicare HMO | Admitting: Podiatry

## 2016-04-02 VITALS — BP 145/77 | HR 71 | Resp 18

## 2016-04-02 DIAGNOSIS — L608 Other nail disorders: Secondary | ICD-10-CM

## 2016-04-02 DIAGNOSIS — R0989 Other specified symptoms and signs involving the circulatory and respiratory systems: Secondary | ICD-10-CM | POA: Diagnosis not present

## 2016-04-02 NOTE — Progress Notes (Signed)
   Subjective:    Patient ID: Barry Welch, male    DOB: [birth year], 76 y.o.   MRN: [id number]  HPI     This patient presents today with the patient's sister and her daughter present to treatment room. The patient is requesting a foot examination complaining of coldness and tingling in his feet. Patient is also requesting toenail debridement because of difficulty reaching his feet.  Patient is diabetic and denies history of foot ulceration, claudication or amputation Patient is a former smoker  Review of Systems  HENT: Positive for sinus pressure.   Eyes: Positive for pain.  Musculoskeletal: Positive for back pain and gait problem.  Neurological: Positive for headaches.  All other systems reviewed and are negative.      Objective:   Physical Exam   Orientated 3  Vascular: DP pulses 2/4 right 1/4 left PT pulses nonpalpable bilaterally Capillary reflex within normal limits bilaterally  Neurological: Sensation to 10 g monofilament wire intact 5/5 bilaterally Vibratory sensation nonreactive bilaterally Ankle reflexes equal reactive bilaterally  Dermatological: No open skin lesions bilaterally Texture and turgor within normal limits bilaterally Incurvated toenails 6-10 Hypertrophic fifth toenail left  Musculoskeletal: Manual motor testing dorsi flexion, plantar flexion 5/5 bilaterally HAV bilaterally There is no restriction ankle, subtalar, midtarsal joints bilaterally        Assessment & Plan:   Assessment: Diabetic with absent pedal pulses indicating lower extremity arterial Doppler Incurvated toenails 6-10  Plan: Review the results with patient and patient's relatives at present to treatment room. I informed thinner absent pedal pulses and referring to vascular lab for lower extremity arterial Doppler and will notify them of the results of the vascular lab Incurvated toenails 6-10 were debrided mechanically and electronically without any  bleeding  Reappoint 3 months for nail debridement

## 2016-04-02 NOTE — Patient Instructions (Signed)
Today her diabetic foot screen demonstrated absent pulsations into your feet and I am ordering a circulation test and will notify you of the results  Diabetes and Foot Care Diabetes may cause you to have problems because of poor blood supply (circulation) to your feet and legs. This may cause the skin on your feet to become thinner, break easier, and heal more slowly. Your skin may become dry, and the skin may peel and crack. You may also have nerve damage in your legs and feet causing decreased feeling in them. You may not notice minor injuries to your feet that could lead to infections or more serious problems. Taking care of your feet is one of the most important things you can do for yourself. Follow these instructions at home:  Wear shoes at all times, even in the house. Do not go barefoot. Bare feet are easily injured.  Check your feet daily for blisters, cuts, and redness. If you cannot see the bottom of your feet, use a mirror or ask someone for help.  Wash your feet with warm water (do not use hot water) and mild soap. Then pat your feet and the areas between your toes until they are completely dry. Do not soak your feet as this can dry your skin.  Apply a moisturizing lotion or petroleum jelly (that does not contain alcohol and is unscented) to the skin on your feet and to dry, brittle toenails. Do not apply lotion between your toes.  Trim your toenails straight across. Do not dig under them or around the cuticle. File the edges of your nails with an emery board or nail file.  Do not cut corns or calluses or try to remove them with medicine.  Wear clean socks or stockings every day. Make sure they are not too tight. Do not wear knee-high stockings since they may decrease blood flow to your legs.  Wear shoes that fit properly and have enough cushioning. To break in new shoes, wear them for just a few hours a day. This prevents you from injuring your feet. Always look in your shoes before  you put them on to be sure there are no objects inside.  Do not cross your legs. This may decrease the blood flow to your feet.  If you find a minor scrape, cut, or break in the skin on your feet, keep it and the skin around it clean and dry. These areas may be cleansed with mild soap and water. Do not cleanse the area with peroxide, alcohol, or iodine.  When you remove an adhesive bandage, be sure not to damage the skin around it.  If you have a wound, look at it several times a day to make sure it is healing.  Do not use heating pads or hot water bottles. They may burn your skin. If you have lost feeling in your feet or legs, you may not know it is happening until it is too late.  Make sure your health care provider performs a complete foot exam at least annually or more often if you have foot problems. Report any cuts, sores, or bruises to your health care provider immediately. Contact a health care provider if:  You have an injury that is not healing.  You have cuts or breaks in the skin.  You have an ingrown nail.  You notice redness on your legs or feet.  You feel burning or tingling in your legs or feet.  You have pain or cramps in  your legs and feet.  Your legs or feet are numb.  Your feet always feel cold. Get help right away if:  There is increasing redness, swelling, or pain in or around a wound.  There is a red line that goes up your leg.  Pus is coming from a wound.  You develop a fever or as directed by your health care provider.  You notice a bad smell coming from an ulcer or wound. This information is not intended to replace advice given to you by your health care provider. Make sure you discuss any questions you have with your health care provider. Document Released: 02/22/2000 Document Revised: 08/02/2015 Document Reviewed: 08/03/2012 Elsevier Interactive Patient Education  2017 [employer].

## 2016-04-03 ENCOUNTER — Other Ambulatory Visit: Payer: Self-pay | Admitting: Family

## 2016-04-03 NOTE — Addendum Note (Signed)
Addended by: Harriett Sine D on: 04/03/2016 10:16 AM   Modules accepted: Orders

## 2016-04-25 ENCOUNTER — Encounter (HOSPITAL_COMMUNITY): Payer: Medicare HMO

## 2016-05-19 ENCOUNTER — Encounter (HOSPITAL_COMMUNITY): Payer: Medicare HMO

## 2016-05-22 DIAGNOSIS — E785 Hyperlipidemia, unspecified: Secondary | ICD-10-CM | POA: Diagnosis not present

## 2016-05-22 DIAGNOSIS — N183 Chronic kidney disease, stage 3 (moderate): Secondary | ICD-10-CM | POA: Diagnosis not present

## 2016-05-22 DIAGNOSIS — I129 Hypertensive chronic kidney disease with stage 1 through stage 4 chronic kidney disease, or unspecified chronic kidney disease: Secondary | ICD-10-CM | POA: Diagnosis not present

## 2016-05-22 DIAGNOSIS — R6889 Other general symptoms and signs: Secondary | ICD-10-CM | POA: Diagnosis not present

## 2016-05-22 DIAGNOSIS — E1129 Type 2 diabetes mellitus with other diabetic kidney complication: Secondary | ICD-10-CM | POA: Diagnosis not present

## 2016-05-22 DIAGNOSIS — E875 Hyperkalemia: Secondary | ICD-10-CM | POA: Diagnosis not present

## 2016-05-22 DIAGNOSIS — M1710 Unilateral primary osteoarthritis, unspecified knee: Secondary | ICD-10-CM | POA: Diagnosis not present

## 2016-05-22 DIAGNOSIS — I739 Peripheral vascular disease, unspecified: Secondary | ICD-10-CM | POA: Diagnosis not present

## 2016-05-23 ENCOUNTER — Other Ambulatory Visit: Payer: Self-pay | Admitting: Family

## 2016-06-02 ENCOUNTER — Other Ambulatory Visit: Payer: Self-pay | Admitting: "Endocrinology

## 2016-06-02 DIAGNOSIS — N183 Chronic kidney disease, stage 3 (moderate): Secondary | ICD-10-CM | POA: Diagnosis not present

## 2016-06-02 DIAGNOSIS — E1122 Type 2 diabetes mellitus with diabetic chronic kidney disease: Secondary | ICD-10-CM | POA: Diagnosis not present

## 2016-06-02 DIAGNOSIS — N08 Glomerular disorders in diseases classified elsewhere: Secondary | ICD-10-CM | POA: Diagnosis not present

## 2016-06-02 DIAGNOSIS — E785 Hyperlipidemia, unspecified: Secondary | ICD-10-CM | POA: Diagnosis not present

## 2016-06-02 DIAGNOSIS — N184 Chronic kidney disease, stage 4 (severe): Secondary | ICD-10-CM | POA: Diagnosis not present

## 2016-06-04 ENCOUNTER — Other Ambulatory Visit: Payer: Self-pay | Admitting: "Endocrinology

## 2016-06-06 ENCOUNTER — Other Ambulatory Visit: Payer: Self-pay | Admitting: "Endocrinology

## 2016-07-02 ENCOUNTER — Ambulatory Visit: Payer: Medicare HMO | Admitting: Podiatry

## 2016-07-03 ENCOUNTER — Ambulatory Visit (HOSPITAL_COMMUNITY)
Admission: RE | Admit: 2016-07-03 | Discharge: 2016-07-03 | Disposition: A | Discharge status: Home / Self Care | Payer: Medicare HMO | Source: Ambulatory Visit | Attending: Podiatry | Admitting: Podiatry

## 2016-07-03 DIAGNOSIS — R9439 Abnormal result of other cardiovascular function study: Secondary | ICD-10-CM | POA: Diagnosis not present

## 2016-07-03 DIAGNOSIS — R0989 Other specified symptoms and signs involving the circulatory and respiratory systems: Secondary | ICD-10-CM | POA: Diagnosis not present

## 2016-07-03 DIAGNOSIS — I739 Peripheral vascular disease, unspecified: Secondary | ICD-10-CM | POA: Insufficient documentation

## 2016-07-03 LAB — VAS US ABI WITH/WO TBI
LATIBDISTSYS: 68 cm/s
LPTIBDISTSYS: -13 cm/s
LSFPPSV: 63 cm/s
Left super femoral dist sys PSV: -73 cm/s
Left super femoral mid sys PSV: -80 cm/s
RIGHT ANT DIST TIBAL SYS PSV: 18 cm/s
RIGHT POST TIB DIST SYS: 18 cm/s
Right super femoral dist sys PSV: -83 cm/s
Right super femoral mid sys PSV: -100 cm/s
Right super femoral prox sys PSV: -72 cm/s

## 2016-07-09 ENCOUNTER — Ambulatory Visit (INDEPENDENT_AMBULATORY_CARE_PROVIDER_SITE_OTHER): Payer: Medicare HMO | Admitting: Podiatry

## 2016-07-09 ENCOUNTER — Telehealth: Payer: Self-pay | Admitting: *Deleted

## 2016-07-09 DIAGNOSIS — R0989 Other specified symptoms and signs involving the circulatory and respiratory systems: Secondary | ICD-10-CM | POA: Diagnosis not present

## 2016-07-09 DIAGNOSIS — I739 Peripheral vascular disease, unspecified: Secondary | ICD-10-CM | POA: Diagnosis not present

## 2016-07-09 DIAGNOSIS — L608 Other nail disorders: Secondary | ICD-10-CM

## 2016-07-09 NOTE — Telephone Encounter (Addendum)
Dr. Amalia Hailey states pt's arterial dopplers of 07/03/2016 are borderline and he would to know if [facility] doctors would like to see pt for evaluation and treatment. Left message on [facility] - Nurses line with message to call with recommendation.07/10/2016-Left message on [facility] - Nurses line with request for recommendation of if pt needs to be seen in their office for evaluation and treatment for results of 07/03/2016 arterial dopplers. Arbie Cookey - [facility] states she spoke with Dr. Oneida Alar, and they reviewed pt's LOV and said if pt had a wound then they would definitely like to follow, or if Dr. Amalia Hailey would like pt followed and evaluated they would accept referral. I told Arbie Cookey - [facility] I would send referral, because Dr. Amalia Hailey would like pt followed. Referral faxed to [facility], with note stating Arbie Cookey spoke with Dr. Oneida Alar concerning. I informed the dtr, Benjamine Mola that accompanied pt to [facility] on 07/09/2016 of referral and gave [facility] [phone].

## 2016-07-09 NOTE — Patient Instructions (Addendum)
Our office Margit Hanks will contact the vascular surgeon's office and notify you if they want to follow based on the arterial Doppler examination for circulation Diabetes and Foot Care Diabetes may cause you to have problems because of poor blood supply (circulation) to your feet and legs. This may cause the skin on your feet to become thinner, break easier, and heal more slowly. Your skin may become dry, and the skin may peel and crack. You may also have nerve damage in your legs and feet causing decreased feeling in them. You may not notice minor injuries to your feet that could lead to infections or more serious problems. Taking care of your feet is one of the most important things you can do for yourself. Follow these instructions at home:  Wear shoes at all times, even in the house. Do not go barefoot. Bare feet are easily injured.  Check your feet daily for blisters, cuts, and redness. If you cannot see the bottom of your feet, use a mirror or ask someone for help.  Wash your feet with warm water (do not use hot water) and mild soap. Then pat your feet and the areas between your toes until they are completely dry. Do not soak your feet as this can dry your skin.  Apply a moisturizing lotion or petroleum jelly (that does not contain alcohol and is unscented) to the skin on your feet and to dry, brittle toenails. Do not apply lotion between your toes.  Trim your toenails straight across. Do not dig under them or around the cuticle. File the edges of your nails with an emery board or nail file.  Do not cut corns or calluses or try to remove them with medicine.  Wear clean socks or stockings every day. Make sure they are not too tight. Do not wear knee-high stockings since they may decrease blood flow to your legs.  Wear shoes that fit properly and have enough cushioning. To break in new shoes, wear them for just a few hours a day. This prevents you from injuring your feet. Always look in  your shoes before you put them on to be sure there are no objects inside.  Do not cross your legs. This may decrease the blood flow to your feet.  If you find a minor scrape, cut, or break in the skin on your feet, keep it and the skin around it clean and dry. These areas may be cleansed with mild soap and water. Do not cleanse the area with peroxide, alcohol, or iodine.  When you remove an adhesive bandage, be sure not to damage the skin around it.  If you have a wound, look at it several times a day to make sure it is healing.  Do not use heating pads or hot water bottles. They may burn your skin. If you have lost feeling in your feet or legs, you may not know it is happening until it is too late.  Make sure your health care provider performs a complete foot exam at least annually or more often if you have foot problems. Report any cuts, sores, or bruises to your health care provider immediately. Contact a health care provider if:  You have an injury that is not healing.  You have cuts or breaks in the skin.  You have an ingrown nail.  You notice redness on your legs or feet.  You feel burning or tingling in your legs or feet.  You have pain or cramps in  your legs and feet.  Your legs or feet are numb.  Your feet always feel cold. Get help right away if:  There is increasing redness, swelling, or pain in or around a wound.  There is a red line that goes up your leg.  Pus is coming from a wound.  You develop a fever or as directed by your health care provider.  You notice a bad smell coming from an ulcer or wound. This information is not intended to replace advice given to you by your health care provider. Make sure you discuss any questions you have with your health care provider. Document Released: 02/22/2000 Document Revised: 08/02/2015 Document Reviewed: 08/03/2012 Elsevier Interactive Patient Education  2017 [employer].

## 2016-07-10 NOTE — Progress Notes (Signed)
Patient ID: Barry Welch, male   DOB: 1940/04/07, 76 y.o.   MRN: [id number]   Subjective: This patient presents today with his daughter present to treatment room for discussion of lower extremity arterial Doppler dated 07/03/2016. Also, patient is requesting debridement of toenails which are uncomfortable when walking wearing shoes.  Objective: Results of cardiovascular imaging at [facility] dated 07/03/2016 No evidence of significant right lower extremity arterial disease based on the right ankle brachial index. Although the ABIs in the right lower extremity appear normal, this is most likely false elevated due to calcified vessel The left ankle brachial index suggests mild left lower extremity arterial occlusive disease The bilateral toe brachial indices are abnormal Additional impressions: Patient is bilateral arterial flow in lower extremities without evidence of hemodynamically significant stenosis: However, calcified vessels noted throughout. Monophasic waveforms in the bilateral posterior tibial arteries and right anterior tibial artery   Vascular: DP pulses 2/4 right 1/4 left PT pulses nonpalpable bilaterally Capillary reflex within normal limits bilaterally  Neurological: Sensation to 10 g monofilament wire intact 5/5 bilaterally Vibratory sensation nonreactive bilaterally Ankle reflexes equal reactive bilaterally  Dermatological: No open skin lesions bilaterally Texture and turgor within normal limits bilaterally Incurvated toenails 6-10 Hypertrophic fifth toenail left  Musculoskeletal: Manual motor testing dorsi flexion, plantar flexion 5/5 bilaterally HAV bilaterally There is no restriction ankle, subtalar, midtarsal joints bilaterally  Assessment: 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 At this time I explained to him there was evidence of mild peripheral arterial disease. Our office will contact the office of vascular and vein to ask if the vascular surgeons wish to have a follow-up based on this abnormal arterial Doppler. Our office will contact patient upon consulting with vascular surgeons office in notify patient if they require follow-up.  Incurvated toenails 6-10 were debrided mechanically and electrically without any bleeding  Reappoint 3 months

## 2016-07-25 ENCOUNTER — Encounter: Payer: Self-pay | Admitting: Vascular Surgery

## 2016-08-06 ENCOUNTER — Ambulatory Visit (INDEPENDENT_AMBULATORY_CARE_PROVIDER_SITE_OTHER): Payer: Medicare HMO | Admitting: Vascular Surgery

## 2016-08-06 ENCOUNTER — Encounter: Payer: Self-pay | Admitting: Vascular Surgery

## 2016-08-06 VITALS — BP 140/82 | HR 81 | Temp 97.0°F | Resp 16 | Ht 73.0 in | Wt 199.0 lb

## 2016-08-06 DIAGNOSIS — I739 Peripheral vascular disease, unspecified: Secondary | ICD-10-CM

## 2016-08-06 NOTE — Progress Notes (Signed)
Patient name: Barry Welch MRN: [id number] DOB: 07-03-1940 Sex: male   REASON FOR CONSULT:    Diminished pulses. Referred by Dr. Amalia Hailey.  HPI:   Barry Welch is a 76 y.o. male, who was sent for evaluation of peripheral vascular disease.  I have reviewed the records that were sent from Dr. Phoebe Perch office. He was noted to have diminished pedal pulses and therefore was set up for a noninvasive study. The patient denies any history of claudication, rest pain, or nonhealing ulcers.  His risk factors for peripheral vascular disease include hypertension, hyperlipidemia, and diabetes. He is not a smoker. He has no family history of premature cardiovascular disease.  Past Medical History:  Diagnosis Date  . Arthritis   . CKD (chronic kidney disease)   . Diabetes mellitus   . Diverticulosis   . Gout involving toe of right foot   . Headache   . Hyperlipidemia   . Hypertension     No family history on file.  SOCIAL HISTORY: Social History   Social History  . Marital status: Legally Separated    Spouse name: [name]  . Number of children: [name]  . Years of education: [name]   Occupational History  . Not on file.   Social History Main Topics  . Smoking status: Former Smoker    Quit date: 03/11/2007  . Smokeless tobacco: Never Used  . Alcohol use No  . Drug use: No  . Sexual activity: Not on file   Other Topics Concern  . Not on file   Social History Narrative   DISABLED X 12 YRS    SEPARATED X 12 YRS     5 CHILDREN          No Known Allergies  Current Outpatient Prescriptions  Medication Sig Dispense Refill  . ACCU-CHEK SMARTVIEW test strip TEST TWICE A DAY . DX E11.22 100 each 9  . acetaminophen (TYLENOL) 500 MG tablet Take 500 mg by mouth every 6 (six) hours as needed for mild pain or moderate pain.    Marland Kitchen aspirin EC 81 MG tablet Take 81 mg by mouth daily.    Marland Kitchen atorvastatin (LIPITOR) 40 MG tablet TAKE 1 TABLET (40 MG TOTAL) BY MOUTH DAILY. 90 tablet 0  . Blood  Glucose Monitoring Suppl (ACCU-CHEK NANO SMARTVIEW) w/Device KIT Use device to test bid. Dx E11.22 1 kit 0  . brimonidine (ALPHAGAN) 0.15 % ophthalmic solution Place 1 drop into both eyes 2 (two) times daily.    . cholecalciferol (VITAMIN D) 1000 UNITS tablet Take 1,000 Units by mouth every morning.     . Exenatide ER 2 MG PEN Inject into the skin.    . fenofibrate (TRICOR) 145 MG tablet TAKE 1 TABLET (145 MG TOTAL) BY MOUTH DAILY. 90 tablet 0  . Lancets (ACCU-CHEK SOFT TOUCH) lancets Use to test blood sugar bid. Dx E11.22 100 each 5  . linagliptin (TRADJENTA) 5 MG TABS tablet Take 1 tablet (5 mg total) by mouth daily. 90 tablet 0  . NOVOFINE 30G X 8 MM MISC USE TO INJECT INSULIN TWICE A DAY AS INSTRUCTED 100 each 2  . NOVOLOG MIX 70/30 FLEXPEN (70-30) 100 UNIT/ML FlexPen INJECT 40 UNITS UNDER THE SKIN EVERY MORNING AND 35 UNITS EVERY EVENING AS INSTRUCTED 30 mL 2  . olopatadine (PATANOL) 0.1 % ophthalmic solution Place 1 drop into both eyes 2 (two) times daily as needed for allergies.    . Omega-3 Fatty Acids (FISH OIL) 1000 MG CAPS Take 1 capsule  by mouth 2 (two) times daily.    Vladimir Faster Glycol-Propyl Glycol (SYSTANE ULTRA OP) Apply 1 drop to eye as needed (Dry eyes).      No current facility-administered medications for this visit.     REVIEW OF SYSTEMS:  [X]  denotes positive finding, [ ]  denotes negative finding Cardiac  Comments:  Chest pain or chest pressure:    Shortness of breath upon exertion: X   Short of breath when lying flat:    Irregular heart rhythm:        Vascular    Pain in calf, thigh, or hip brought on by ambulation:    Pain in feet at night that wakes you up from your sleep:     Blood clot in your veins:    Leg swelling:         Pulmonary    Oxygen at home:    Productive cough:     Wheezing:         Neurologic    Sudden weakness in arms or legs:     Sudden numbness in arms or legs:     Sudden onset of difficulty speaking or slurred speech:    Temporary  loss of vision in one eye:     Problems with dizziness:         Gastrointestinal    Blood in stool:     Vomited blood:         Genitourinary    Burning when urinating:     Blood in urine:        Psychiatric    Major depression:         Hematologic    Bleeding problems:    Problems with blood clotting too easily:        Skin    Rashes or ulcers:        Constitutional    Fever or chills:     PHYSICAL EXAM:   Vitals:   08/06/16 1344  BP: 140/82  Pulse: 81  Resp: 16  Temp: 97 F (36.1 C)  TempSrc: Oral  SpO2: 97%  Weight: 199 lb (90.3 kg)  Height: 6' 1"  (1.854 m)    GENERAL: The patient is a well-nourished male, in no acute distress. The vital signs are documented above. CARDIAC: There is a regular rate and rhythm.  VASCULAR: I do not detect carotid bruits. On the right side, he has a palpable femoral and popliteal pulse. I cannot palpate pedal pulses. On the left side, he has a palpable femoral and popliteal pulse. I cannot palpate pedal pulses. He has no significant lower extremity swelling. PULMONARY: There is good air exchange bilaterally without wheezing or rales. ABDOMEN: Soft and non-tender with normal pitched bowel sounds.  MUSCULOSKELETAL: There are no major deformities or cyanosis. NEUROLOGIC: No focal weakness or paresthesias are detected. SKIN: There are no ulcers or rashes noted. PSYCHIATRIC: The patient has a normal affect.  DATA:    LOWER EXTREMITY ARTERIAL DOPPLER STUDY: I reviewed the lower exterior arterial Doppler study that was done on 07/03/2016.  On the right side there is a monophasic dorsalis pedis and posterior tibial signal with an ABI of 100%. The pressure on the right is 85 mmHg.  On the left side, there is a monophasic dorsalis pedis signal. There is a biphasic posterior tibial signal. ABI is 82% on the left. Toe pressure on the left is 69 mmHg.  LOWER EXTREMITY ARTERIAL DUPLEX: I have reviewed the lower extremity arterial duplex  and  it was done on 07/03/2016.  The superficial femoral arteries are patent bilaterally with no hemodynamically significant stenoses identified. The patient does have calcific vessels throughout. There are monophasic Doppler signals noted in both posterior tibial arteries and also in the right anterior tibial artery.  MEDICAL ISSUES:   BILATERAL INFRAPOPLITEAL ARTERY OCCLUSIVE DISEASE: Based on his exam the patient has evidence of tibial artery occlusive disease bilaterally but really has no symptoms. His toe pressures are very reasonable and I would not recommend any further vascular workup at this time unless he developed disabling claudication, rest pain, or nonhealing ulcer. Fortunately he is not a smoker. I have encouraged him to stay as active as possible and to walk as much as possible. We have also discussed the importance of nutrition. 4 Shiley, he is not a smoker. I'll be happy to see him back at any time if any new vascular issues arise.  Deitra Mayo [facility] [phone]

## 2016-09-04 DIAGNOSIS — N183 Chronic kidney disease, stage 3 (moderate): Secondary | ICD-10-CM | POA: Diagnosis not present

## 2016-09-04 DIAGNOSIS — N08 Glomerular disorders in diseases classified elsewhere: Secondary | ICD-10-CM | POA: Diagnosis not present

## 2016-09-04 DIAGNOSIS — E1122 Type 2 diabetes mellitus with diabetic chronic kidney disease: Secondary | ICD-10-CM | POA: Diagnosis not present

## 2016-09-04 DIAGNOSIS — N184 Chronic kidney disease, stage 4 (severe): Secondary | ICD-10-CM | POA: Diagnosis not present

## 2016-09-04 DIAGNOSIS — E785 Hyperlipidemia, unspecified: Secondary | ICD-10-CM | POA: Diagnosis not present

## 2016-09-30 ENCOUNTER — Other Ambulatory Visit: Payer: Self-pay | Admitting: Family

## 2016-10-03 DIAGNOSIS — E119 Type 2 diabetes mellitus without complications: Secondary | ICD-10-CM | POA: Diagnosis not present

## 2016-10-03 DIAGNOSIS — Z961 Presence of intraocular lens: Secondary | ICD-10-CM | POA: Diagnosis not present

## 2016-10-03 DIAGNOSIS — H401132 Primary open-angle glaucoma, bilateral, moderate stage: Secondary | ICD-10-CM | POA: Diagnosis not present

## 2016-10-03 DIAGNOSIS — Z7984 Long term (current) use of oral hypoglycemic drugs: Secondary | ICD-10-CM | POA: Diagnosis not present

## 2016-10-09 DIAGNOSIS — E1151 Type 2 diabetes mellitus with diabetic peripheral angiopathy without gangrene: Secondary | ICD-10-CM | POA: Diagnosis not present

## 2016-10-09 DIAGNOSIS — B351 Tinea unguium: Secondary | ICD-10-CM | POA: Diagnosis not present

## 2016-10-09 DIAGNOSIS — L84 Corns and callosities: Secondary | ICD-10-CM | POA: Diagnosis not present

## 2017-09-25 IMAGING — US US MISC SOFT TISSUE
2 series · 14 of 16 positions shown · non-contrast
Comparison: None.

CLINICAL DATA: Large palpable area over the right scapula.

EXAM:
SOFT TISSUE ULTRASOUND - MISCELLANEOUS
TECHNIQUE: Real-time sonography of the right posterior chest wall is performed.

[Series 1: us misc soft tissue · 0.12mm/px · 5 of 7 slices shown (1 of 2)]
[im 1/7]
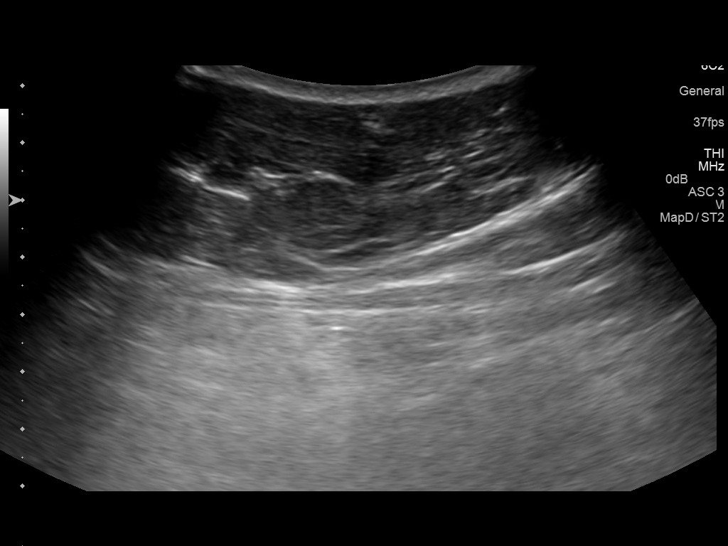
[im 2/7]
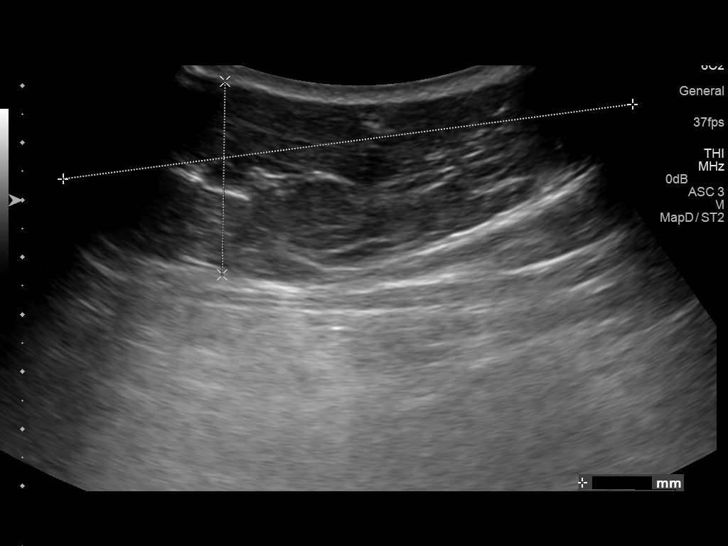
[im 3/7]
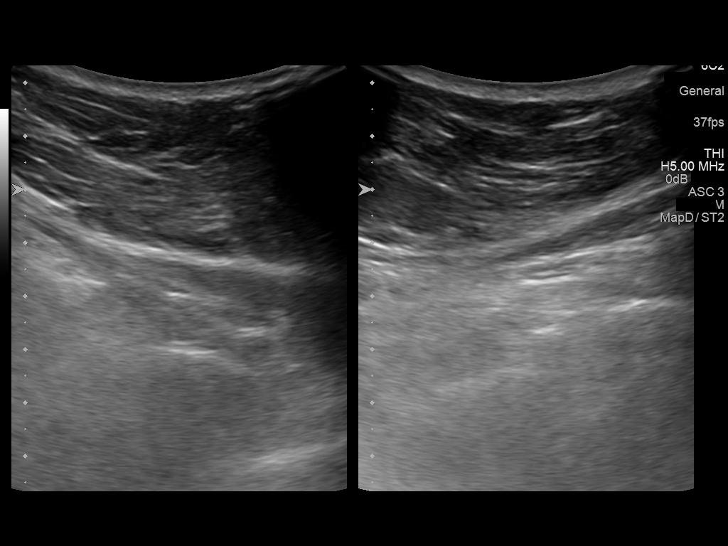
[im 5/7]
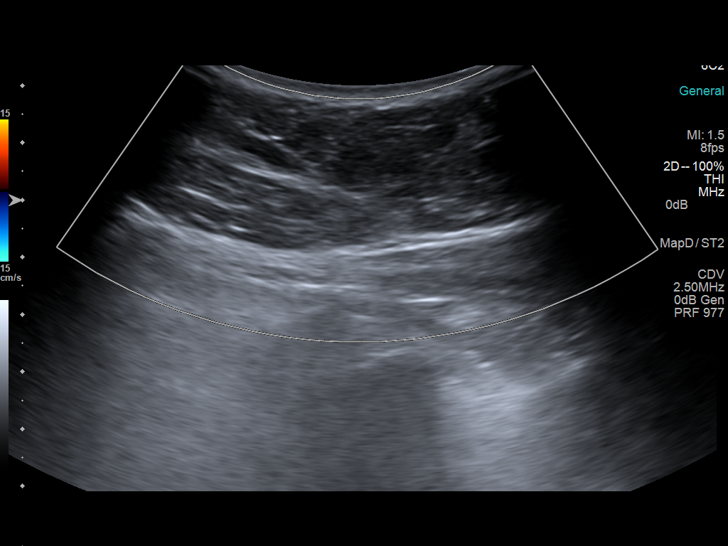
[im 7/7]
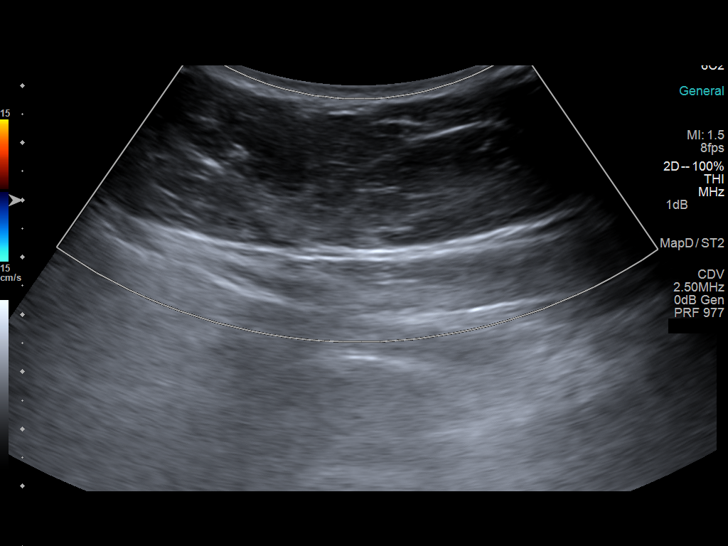

[Series 2: us misc soft tissue · 0.09mm/px · 9 of 13 slices shown (2 of 2)]
[im 1/13]
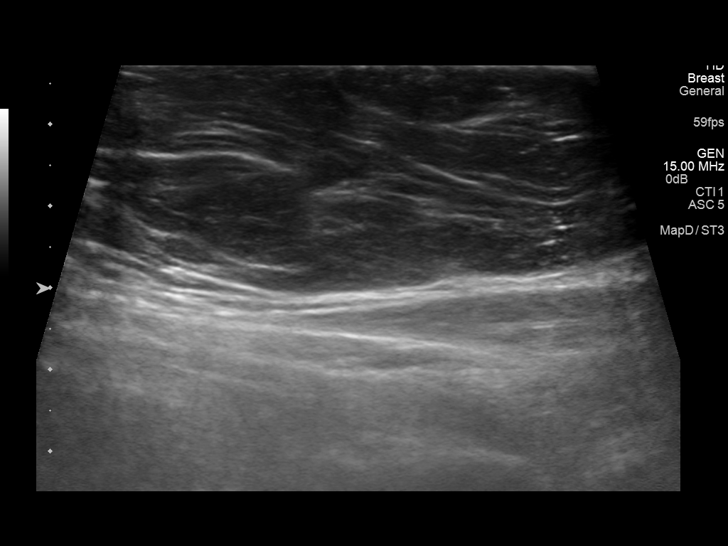
[im 2/13]
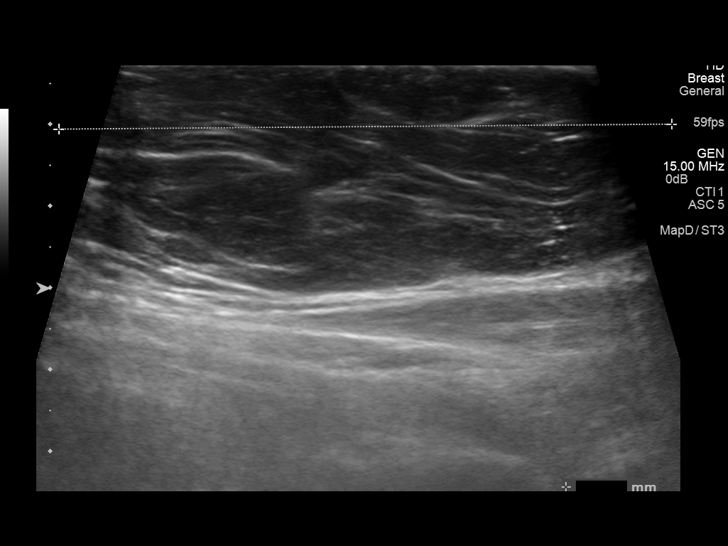
[im 3/13]
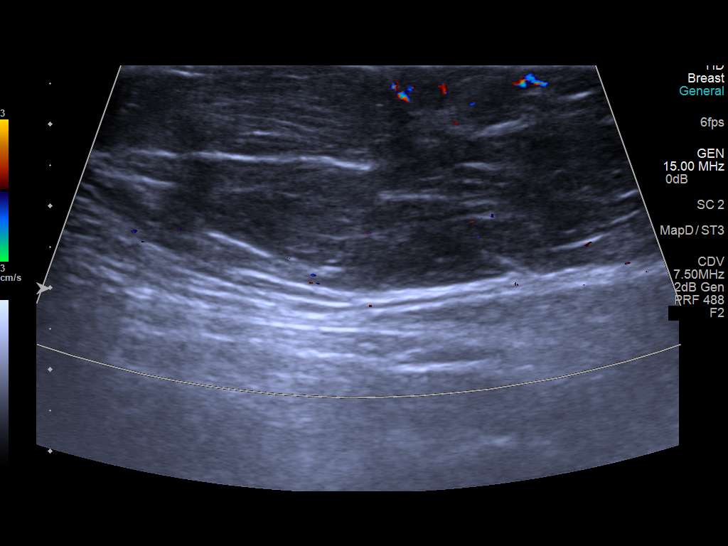
[im 5/13]
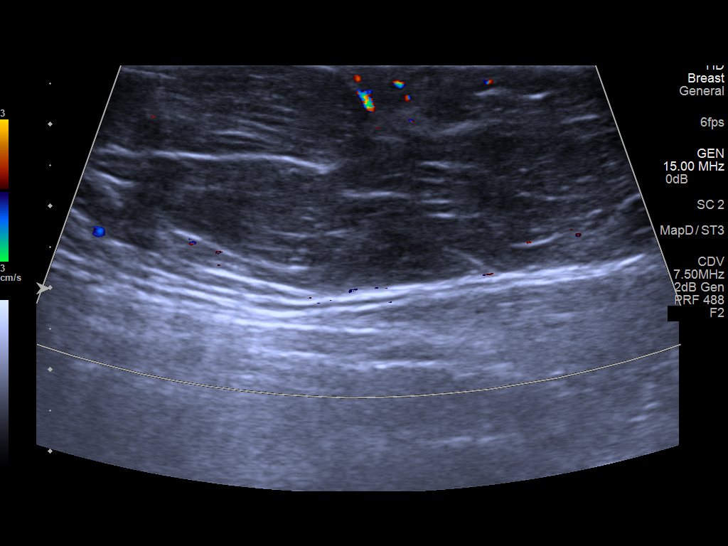
[im 6/13]
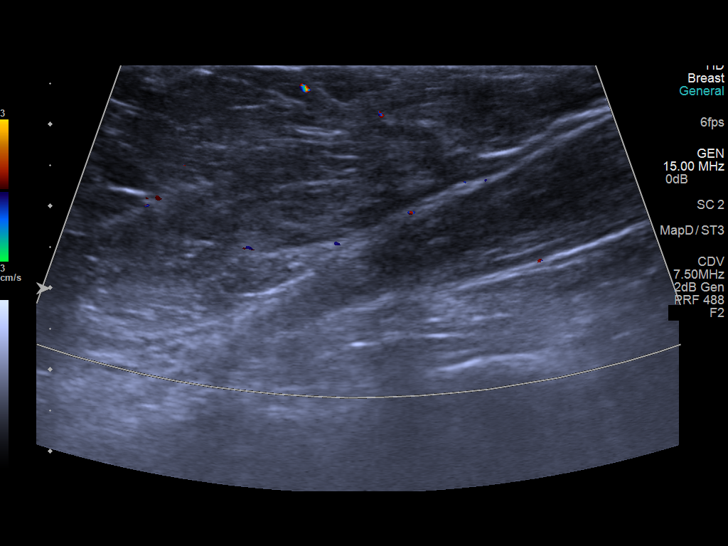
[im 9/13]
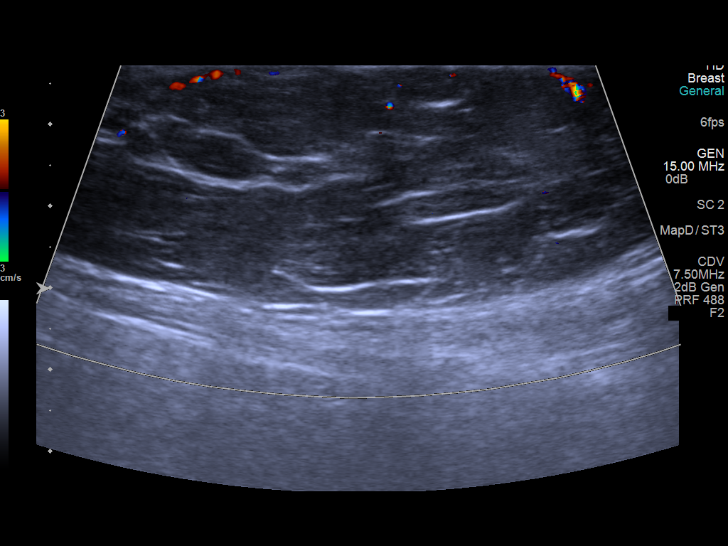
[im 10/13]
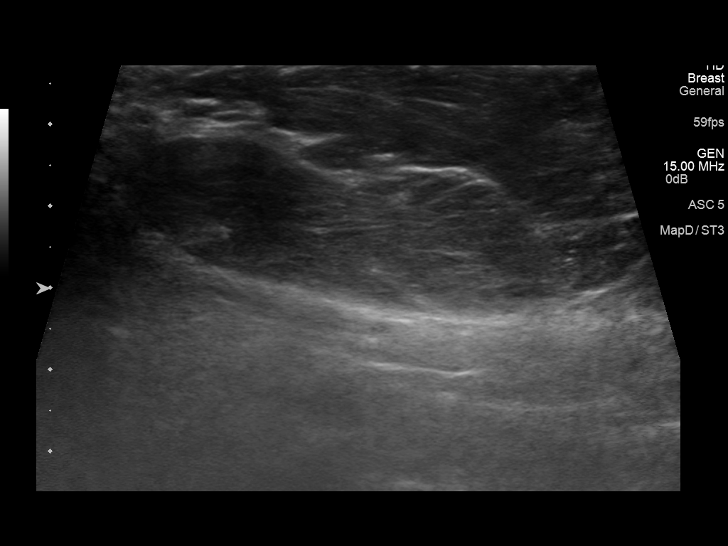
[im 11/13]
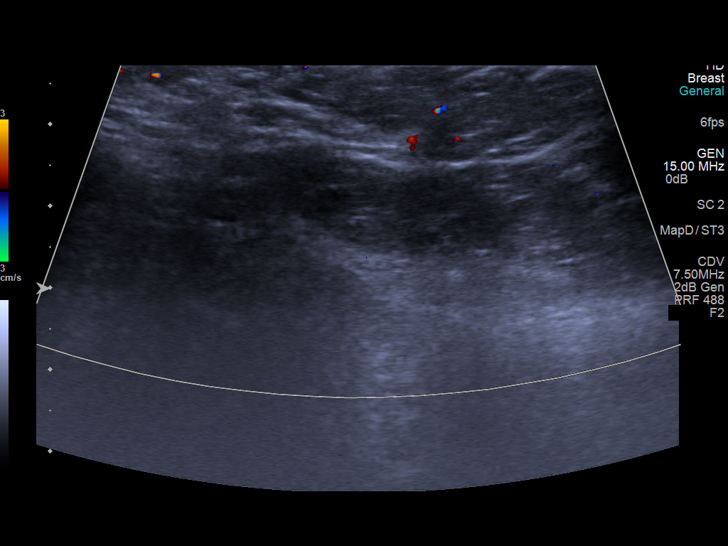
[im 13/13]
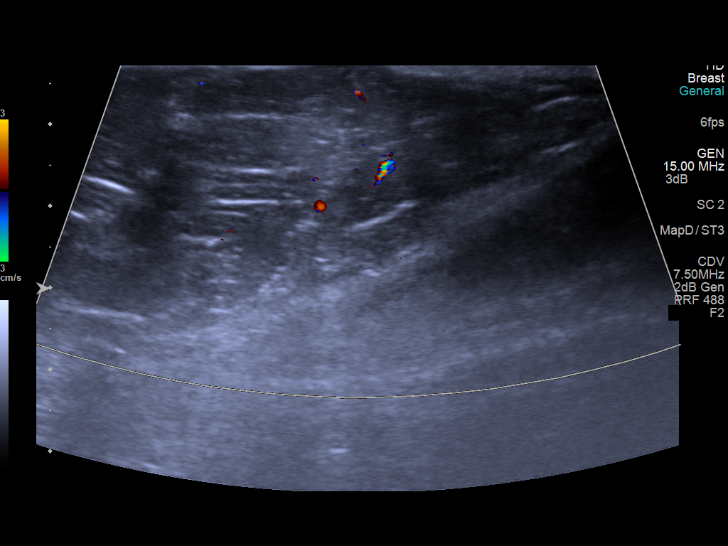

[14 of 16 positions shown; findings below may reference images not displayed]

FINDINGS: There is a 11.1 x 3 x 12.4 cm well-defined heterogeneously
hypoechoic mass with mild internal Doppler flow. The mass appears
solid. The mass is located within the subcutaneous fat superficial
to the scapula. There is no posterior acoustic enhancement or
shadowing.

There is no other solid or cystic mass.
IMPRESSION: 11.1 x 3 x 12.4 cm soft tissue mass in the subcutaneous fat
overlying the right scapula. The appearance is nonspecific and may
reflect a lipoma, but other soft tissue masses cannot be excluded.
Further evaluation with an MRI of the area of concern is
recommended. If the patient is unable to have an MRI, a CT of the
chest is recommended.

## 2018-06-02 ENCOUNTER — Other Ambulatory Visit: Payer: Self-pay | Admitting: Nurse Practitioner

## 2019-03-01 ENCOUNTER — Other Ambulatory Visit: Payer: Self-pay | Admitting: Family

## 2019-03-01 DIAGNOSIS — R1031 Right lower quadrant pain: Secondary | ICD-10-CM

## 2019-03-07 ENCOUNTER — Ambulatory Visit (HOSPITAL_COMMUNITY): Payer: Medicare HMO

## 2019-03-24 ENCOUNTER — Ambulatory Visit (HOSPITAL_COMMUNITY)
Admission: RE | Admit: 2019-03-24 | Discharge: 2019-03-24 | Disposition: A | Discharge status: Home / Self Care | Payer: Medicare HMO | Source: Ambulatory Visit | Attending: Family | Admitting: Family

## 2019-03-24 ENCOUNTER — Other Ambulatory Visit: Payer: Self-pay

## 2019-03-24 DIAGNOSIS — R1031 Right lower quadrant pain: Secondary | ICD-10-CM | POA: Insufficient documentation

## 2019-03-31 ENCOUNTER — Other Ambulatory Visit (HOSPITAL_COMMUNITY): Payer: Self-pay | Admitting: Family

## 2019-03-31 ENCOUNTER — Other Ambulatory Visit: Payer: Self-pay | Admitting: Family

## 2019-03-31 DIAGNOSIS — S301XXA Contusion of abdominal wall, initial encounter: Secondary | ICD-10-CM

## 2019-04-12 ENCOUNTER — Ambulatory Visit (HOSPITAL_COMMUNITY)
Admission: RE | Admit: 2019-04-12 | Discharge: 2019-04-12 | Disposition: A | Discharge status: Home / Self Care | Payer: Medicare HMO | Source: Ambulatory Visit | Attending: Family | Admitting: Family

## 2019-04-12 ENCOUNTER — Other Ambulatory Visit: Payer: Self-pay

## 2019-04-12 DIAGNOSIS — S301XXA Contusion of abdominal wall, initial encounter: Secondary | ICD-10-CM

## 2019-04-12 MED ORDER — IOHEXOL 300 MG/ML  SOLN
100.0000 mL | Freq: Once | INTRAMUSCULAR | Status: AC | PRN
  Administered 2019-04-12: 16:00:00 100 mL via INTRAVENOUS

## 2021-11-12 IMAGING — CT CT ABD-PELV W/ CM
3 of 5 series · 16 of 46 positions shown, 18 images · IV contrast (Omnipaque or Isovue)
Comparison: 03/24/2019

CLINICAL DATA: Follow-up masslike enlargement of the left lower
rectus abdominus muscle

EXAM:
CT ABDOMEN AND PELVIS WITH CONTRAST
TECHNIQUE: Multidetector CT imaging of the abdomen and pelvis was performed
using the standard protocol following bolus administration of
intravenous contrast.
CONTRAST:  100mL OMNIPAQUE IOHEXOL 300 MG/ML  SOLN

[Series 2: axial st · axial · 0.76mm/px · z∈[+866,+1266]mm · 11 of 96 slices shown, 13 images]
[im 8/96  soft-tissue]
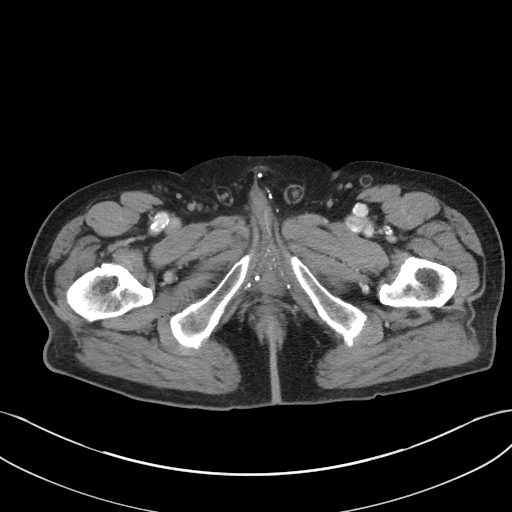
[im 8/96  bone]
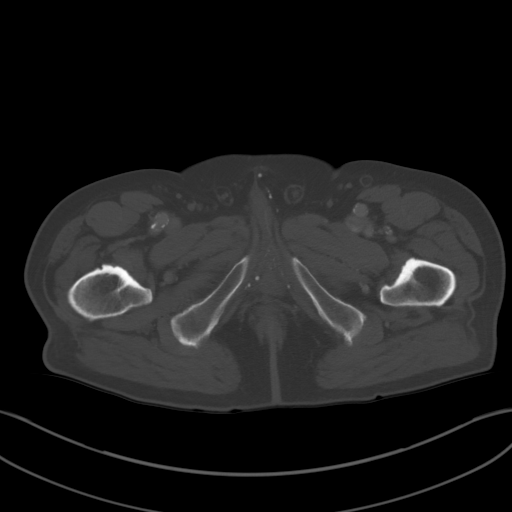
[im 16/96  soft-tissue]
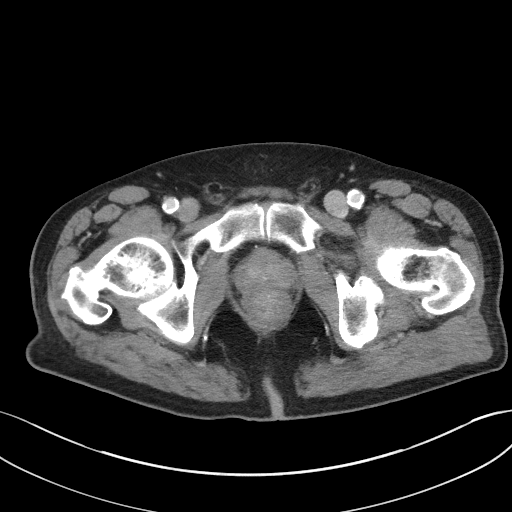
[im 24/96  soft-tissue]
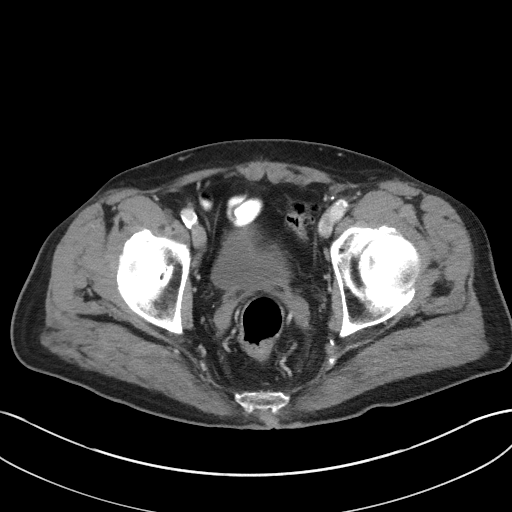
[im 32/96  soft-tissue]
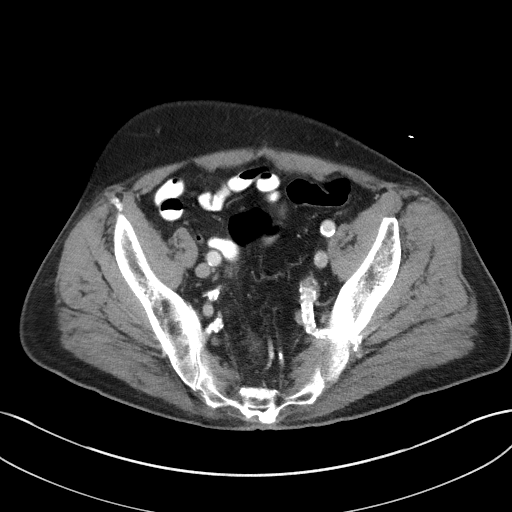
[im 40/96  soft-tissue]
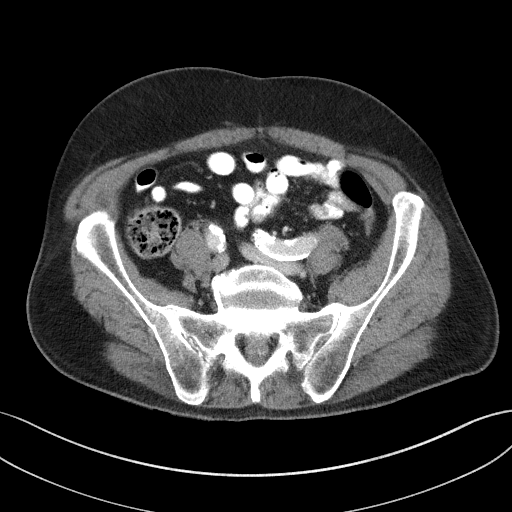
[im 48/96  soft-tissue]
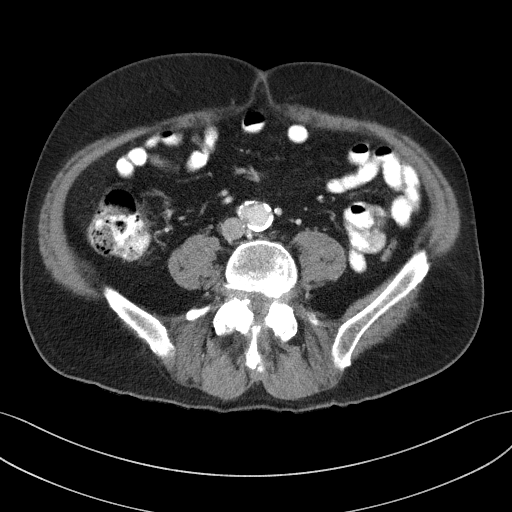
[im 56/96  soft-tissue]
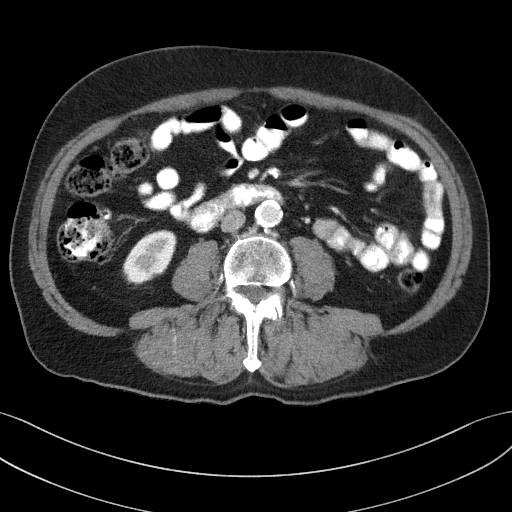
[im 64/96  soft-tissue]
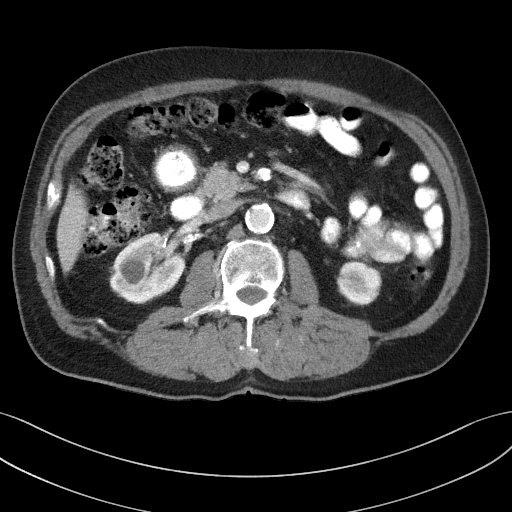
[im 72/96  soft-tissue]
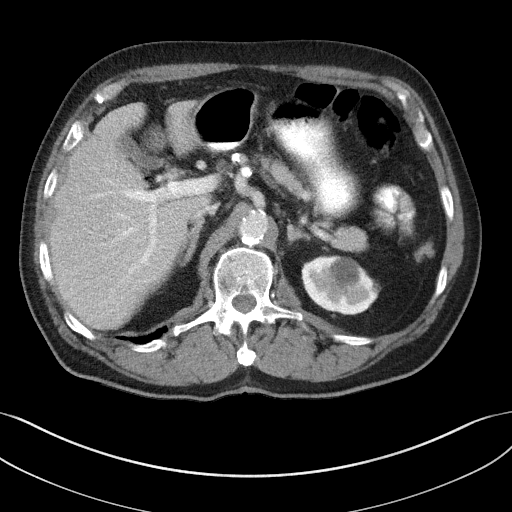
[im 72/96  bone]
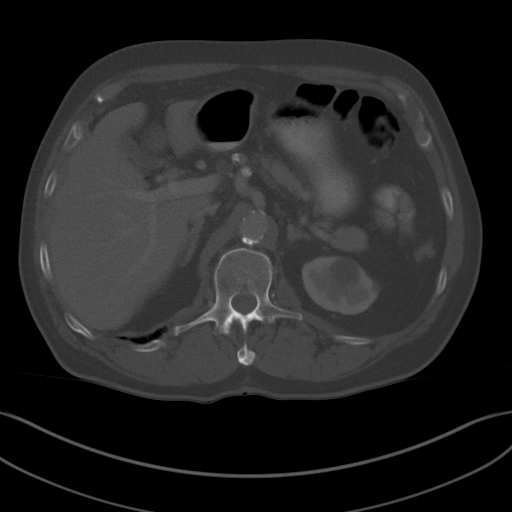
[im 80/96  soft-tissue]
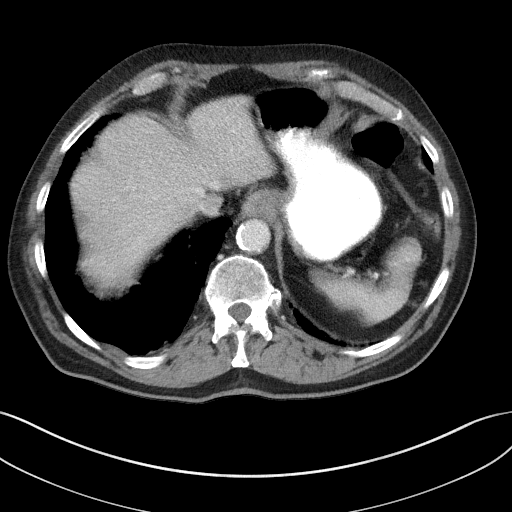
[im 88/96  soft-tissue]
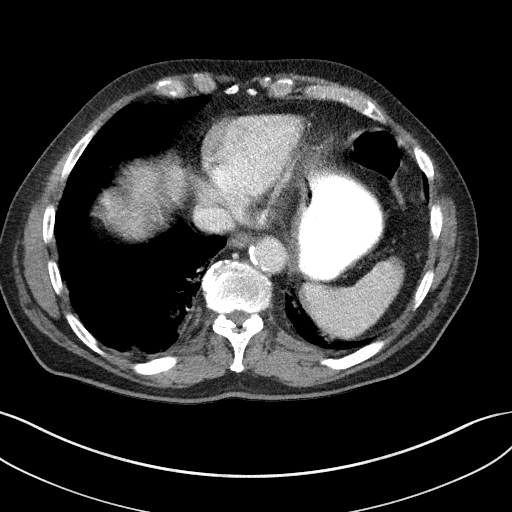

[Series 5: lung bases · axial · 0.76mm/px · z∈[+1126,+1156]mm · 2 of 98 slices shown]
[im 8/98  bone]
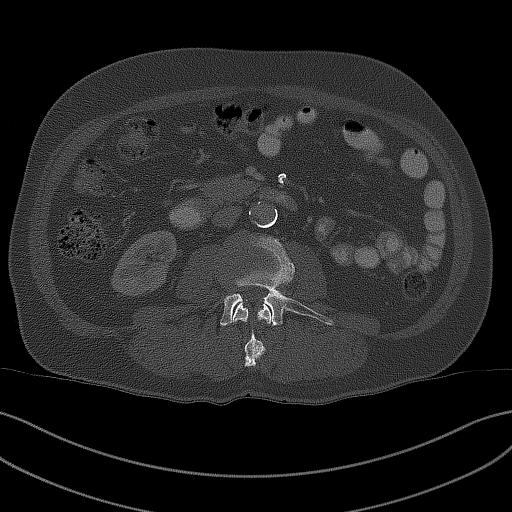
[im 23/98  bone]
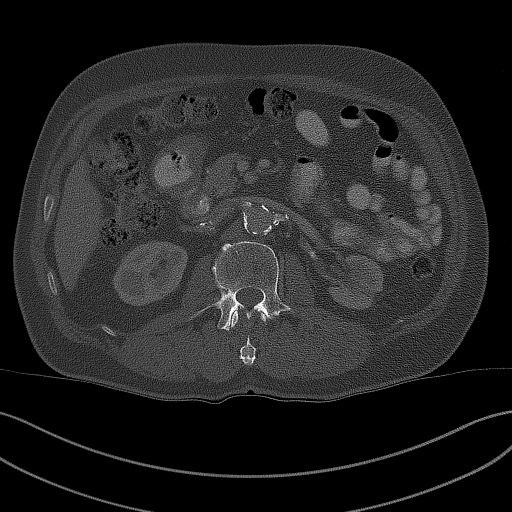

[Series 6: coronal st · coronal · 0.78mm/px · 3 of 117 slices shown]
[im 39/117  soft-tissue]
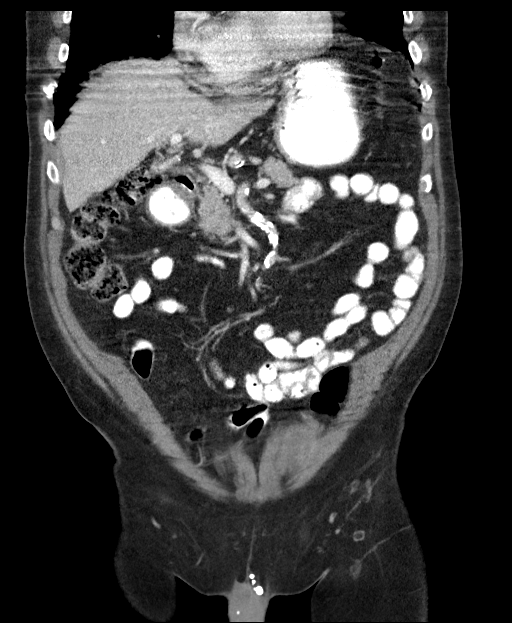
[im 52/117  soft-tissue]
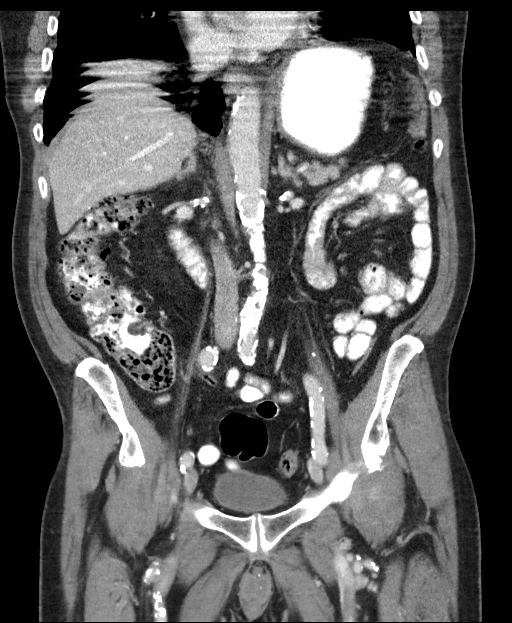
[im 65/117  soft-tissue]
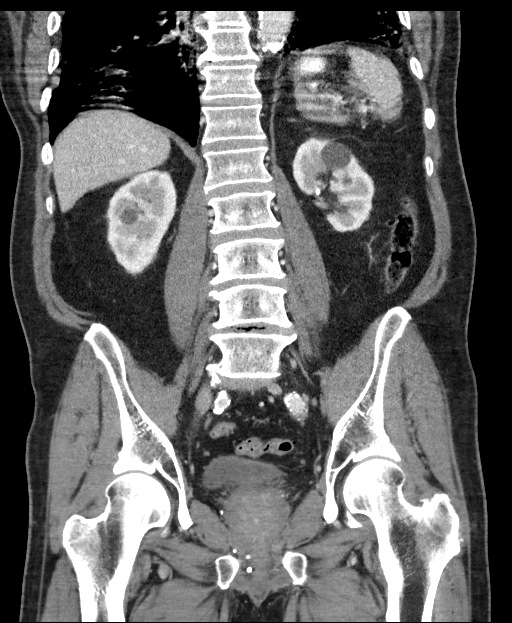

[16 of 46 positions shown; findings below may reference images not displayed]

FINDINGS: Motion degraded images.

Lower chest: Patchy subpleural opacities at the lung bases, motion
degraded. While this appearance may be chronic, atypical infection
including COVID is not excluded.

Hepatobiliary: Liver is within normal limits.

Cholelithiasis (series 2/image 29), without associated inflammatory
changes. No intrahepatic or extrahepatic ductal dilatation.

Pancreas: Within normal limits.

Spleen: Within normal limits.

Adrenals/Urinary Tract: Adrenal glands are within normal limits.

Bilateral renal cysts, measuring up to 2.6 cm in the anterior left
upper pole (series 3/image 9). No hydronephrosis.

Bladder is underdistended but unremarkable.

Stomach/Bowel: Stomach is within normal limits.

No evidence of bowel obstruction.

Normal appendix (series 2/image 59).

Vascular/Lymphatic: No evidence of abdominal aortic aneurysm.

Atherosclerotic calcifications of the abdominal aorta and branch
vessels.

No suspicious abdominopelvic lymphadenopathy.

Reproductive: Prostate is grossly unremarkable.

Other: No abdominopelvic ascites.

Musculoskeletal: Prior masslike enlargement/rectus sheath hematoma
along the left lower anterior abdominal wall has resolved.

Degenerative changes of the visualized thoracolumbar spine.
IMPRESSION: Motion degraded images.

Prior masslike enlargement/rectus sheath hematoma along the left
lower anterior abdominal wall has resolved.

Again noted is patchy subpleural opacities at the lung bases,
although motion degraded. While this appearance may reflect chronic
lung disease, atypical infection including COVID is not excluded.

Cholelithiasis, without associated inflammatory changes.
